# Patient Record
Sex: Female | Born: 1990 | Race: White | Hispanic: No | Marital: Married | Smoking: Never smoker
Health system: Southern US, Community
[De-identification: ages and names within clinical notes are randomized; demographics above are authoritative.]

## PROBLEM LIST (undated history)

## (undated) DIAGNOSIS — I10 Essential (primary) hypertension: Secondary | ICD-10-CM

## (undated) DIAGNOSIS — Z789 Other specified health status: Secondary | ICD-10-CM

## (undated) DIAGNOSIS — R519 Headache, unspecified: Secondary | ICD-10-CM

## (undated) HISTORY — DX: Headache, unspecified: R51.9

## (undated) HISTORY — PX: NO PAST SURGERIES: SHX2092

## (undated) HISTORY — PX: COLONOSCOPY: SHX174

## (undated) HISTORY — DX: Essential (primary) hypertension: I10

---

## 2018-10-21 LAB — OB RESULTS CONSOLE ABO/RH: RH Type: POSITIVE

## 2018-10-21 LAB — OB RESULTS CONSOLE GC/CHLAMYDIA
Chlamydia: NEGATIVE
Gonorrhea: NEGATIVE

## 2018-10-21 LAB — OB RESULTS CONSOLE HEPATITIS B SURFACE ANTIGEN: Hepatitis B Surface Ag: NEGATIVE

## 2018-10-21 LAB — OB RESULTS CONSOLE ANTIBODY SCREEN: Antibody Screen: NEGATIVE

## 2018-10-21 LAB — OB RESULTS CONSOLE HIV ANTIBODY (ROUTINE TESTING): HIV: NONREACTIVE

## 2018-10-21 LAB — OB RESULTS CONSOLE RUBELLA ANTIBODY, IGM: Rubella: IMMUNE

## 2018-10-21 LAB — OB RESULTS CONSOLE RPR: RPR: NONREACTIVE

## 2018-11-18 NOTE — L&D Delivery Note (Signed)
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  Presentation: vertex; Position: Left,, Occiput,, Anterior; Station: +4.  Verbal consent: obtained from patient.  Risks and benefits discussed in detail.  Risks include, but are not limited to the risks of anesthesia, bleeding, infection, damage to maternal tissues, fetal cephalhematoma.  There is also the risk of inability to effect vaginal delivery of the head, or shoulder dystocia that cannot be resolved by established maneuvers, leading to the need for emergency cesarean section.  APGAR: 9, 9; weight  pending.   Placenta status: spontaneous, intact.   Cord:  with the following complications: tight [place] x one cut on perineum.  Cord pH: na  Anesthesia:  epidural Instruments: kiwi x 2 pulls Episiotomy: None Lacerations: 2nd degree Suture Repair: 2.0 3.0 vicryl rapide Est. Blood Loss (mL): 274  Mom to postpartum.  Baby to Couplet care / Skin to Skin.  Kendra Meyer J 04/23/2019, 7:46 PM

## 2019-03-31 ENCOUNTER — Encounter (HOSPITAL_COMMUNITY): Payer: Self-pay

## 2019-03-31 ENCOUNTER — Observation Stay (HOSPITAL_COMMUNITY)
Admission: AD | Admit: 2019-03-31 | Discharge: 2019-04-01 | Disposition: A | Discharge status: Home / Self Care | Payer: Managed Care, Other (non HMO) | Attending: Obstetrics | Admitting: Obstetrics

## 2019-03-31 ENCOUNTER — Other Ambulatory Visit: Payer: Self-pay

## 2019-03-31 DIAGNOSIS — O163 Unspecified maternal hypertension, third trimester: Secondary | ICD-10-CM

## 2019-03-31 DIAGNOSIS — O133 Gestational [pregnancy-induced] hypertension without significant proteinuria, third trimester: Secondary | ICD-10-CM | POA: Diagnosis present

## 2019-03-31 DIAGNOSIS — O479 False labor, unspecified: Secondary | ICD-10-CM | POA: Diagnosis present

## 2019-03-31 DIAGNOSIS — O4700 False labor before 37 completed weeks of gestation, unspecified trimester: Secondary | ICD-10-CM | POA: Diagnosis present

## 2019-03-31 DIAGNOSIS — Z3A33 33 weeks gestation of pregnancy: Secondary | ICD-10-CM | POA: Diagnosis not present

## 2019-03-31 LAB — CBC WITH DIFFERENTIAL/PLATELET
Abs Immature Granulocytes: 0.11 10*3/uL — ABNORMAL HIGH (ref 0.00–0.07)
Basophils Absolute: 0 10*3/uL (ref 0.0–0.1)
Basophils Relative: 0 %
Eosinophils Absolute: 0.1 10*3/uL (ref 0.0–0.5)
Eosinophils Relative: 0 %
HCT: 34 % — ABNORMAL LOW (ref 36.0–46.0)
Hemoglobin: 11.9 g/dL — ABNORMAL LOW (ref 12.0–15.0)
Immature Granulocytes: 1 %
Lymphocytes Relative: 12 %
Lymphs Abs: 2.3 10*3/uL (ref 0.7–4.0)
MCH: 29.8 pg (ref 26.0–34.0)
MCHC: 35 g/dL (ref 30.0–36.0)
MCV: 85.2 fL (ref 80.0–100.0)
Monocytes Absolute: 1 10*3/uL (ref 0.1–1.0)
Monocytes Relative: 6 %
Neutro Abs: 15.3 10*3/uL — ABNORMAL HIGH (ref 1.7–7.7)
Neutrophils Relative %: 81 %
Platelets: 245 10*3/uL (ref 150–400)
RBC: 3.99 MIL/uL (ref 3.87–5.11)
RDW: 12.9 % (ref 11.5–15.5)
WBC: 18.8 10*3/uL — ABNORMAL HIGH (ref 4.0–10.5)
nRBC: 0 % (ref 0.0–0.2)

## 2019-03-31 LAB — COMPREHENSIVE METABOLIC PANEL
ALT: 17 U/L (ref 0–44)
AST: 16 U/L (ref 15–41)
Albumin: 2.9 g/dL — ABNORMAL LOW (ref 3.5–5.0)
Alkaline Phosphatase: 118 U/L (ref 38–126)
Anion gap: 11 (ref 5–15)
BUN: 7 mg/dL (ref 6–20)
CO2: 20 mmol/L — ABNORMAL LOW (ref 22–32)
Calcium: 9.3 mg/dL (ref 8.9–10.3)
Chloride: 106 mmol/L (ref 98–111)
Creatinine, Ser: 0.6 mg/dL (ref 0.44–1.00)
GFR calc Af Amer: >60 mL/min (ref >60)
GFR calc non Af Amer: >60 mL/min (ref >60)
Glucose, Bld: 82 mg/dL (ref 70–99)
Potassium: 3.8 mmol/L (ref 3.5–5.1)
Sodium: 137 mmol/L (ref 135–145)
Total Bilirubin: 0.5 mg/dL (ref 0.3–1.2)
Total Protein: 6.7 g/dL (ref 6.5–8.1)

## 2019-03-31 LAB — TYPE AND SCREEN
ABO/RH(D): A POS
Antibody Screen: NEGATIVE

## 2019-03-31 LAB — PROTEIN / CREATININE RATIO, URINE
Creatinine, Urine: 32.94 mg/dL
Protein Creatinine Ratio: 0.18 mg/mg{Cre} — ABNORMAL HIGH (ref 0.00–0.15)
Total Protein, Urine: 6 mg/dL

## 2019-03-31 MED ORDER — ZOLPIDEM TARTRATE 5 MG PO TABS
5.0000 mg | ORAL_TABLET | Freq: Every evening | ORAL | Status: DC | PRN
  Administered 2019-04-01: 5 mg via ORAL
  Filled 2019-03-31: qty 1

## 2019-03-31 MED ORDER — LACTATED RINGERS IV BOLUS
1000.0000 mL | Freq: Once | INTRAVENOUS | Status: AC
  Administered 2019-03-31: 1000 mL via INTRAVENOUS

## 2019-03-31 MED ORDER — CALCIUM CARBONATE ANTACID 500 MG PO CHEW: 2.0000 | CHEWABLE_TABLET | ORAL | Status: DC | PRN

## 2019-03-31 MED ORDER — PRENATAL MULTIVITAMIN CH
1.0000 | ORAL_TABLET | Freq: Every day | ORAL | Status: DC
  Administered 2019-04-01: 1 via ORAL
  Filled 2019-03-31: qty 1

## 2019-03-31 MED ORDER — SODIUM CHLORIDE 0.9% FLUSH
3.0000 mL | Freq: Two times a day (BID) | INTRAVENOUS | Status: DC
  Administered 2019-03-31 – 2019-04-01 (×2): 3 mL via INTRAVENOUS

## 2019-03-31 MED ORDER — ACETAMINOPHEN 500 MG PO TABS
1000.0000 mg | ORAL_TABLET | Freq: Once | ORAL | Status: AC
  Administered 2019-03-31: 15:00:00 1000 mg via ORAL
  Filled 2019-03-31: qty 2

## 2019-03-31 MED ORDER — NIFEDIPINE 10 MG PO CAPS
20.0000 mg | ORAL_CAPSULE | Freq: Once | ORAL | Status: AC
  Administered 2019-03-31: 18:00:00 20 mg via ORAL
  Filled 2019-03-31: qty 2

## 2019-03-31 MED ORDER — SODIUM CHLORIDE 0.9 % IV SOLN: 250.0000 mL | INTRAVENOUS | Status: DC | PRN

## 2019-03-31 MED ORDER — CYCLOBENZAPRINE HCL 10 MG PO TABS
10.0000 mg | ORAL_TABLET | Freq: Once | ORAL | Status: AC
  Administered 2019-03-31: 15:00:00 10 mg via ORAL
  Filled 2019-03-31: qty 1

## 2019-03-31 MED ORDER — ACETAMINOPHEN 325 MG PO TABS
650.0000 mg | ORAL_TABLET | ORAL | Status: DC | PRN
  Administered 2019-04-01: 650 mg via ORAL
  Filled 2019-03-31: qty 2

## 2019-03-31 MED ORDER — NIFEDIPINE 10 MG PO CAPS
10.0000 mg | ORAL_CAPSULE | ORAL | Status: AC | PRN
  Administered 2019-03-31 (×3): 10 mg via ORAL
  Filled 2019-03-31 (×3): qty 1

## 2019-03-31 MED ORDER — NIFEDIPINE 10 MG PO CAPS
10.0000 mg | ORAL_CAPSULE | Freq: Four times a day (QID) | ORAL | Status: DC
  Administered 2019-04-01 (×4): 10 mg via ORAL
  Filled 2019-03-31 (×5): qty 1

## 2019-03-31 MED ORDER — DOCUSATE SODIUM 100 MG PO CAPS
100.0000 mg | ORAL_CAPSULE | Freq: Every day | ORAL | Status: DC
  Administered 2019-03-31 – 2019-04-01 (×2): 100 mg via ORAL
  Filled 2019-03-31 (×2): qty 1

## 2019-03-31 MED ORDER — BETAMETHASONE SOD PHOS & ACET 6 (3-3) MG/ML IJ SUSP
12.0000 mg | INTRAMUSCULAR | Status: AC
  Administered 2019-03-31 – 2019-04-01 (×2): 12 mg via INTRAMUSCULAR
  Filled 2019-03-31 (×2): qty 2

## 2019-03-31 MED ORDER — SODIUM CHLORIDE 0.9% FLUSH: 3.0000 mL | INTRAVENOUS | Status: DC | PRN

## 2019-03-31 NOTE — H&P (Signed)
Chief complaint: Elevated blood pressures and contractions on NST  History of present illness: 28 year old G2 P0-0-1-0 at 33 weeks and 3 days who was seen in clinic today for routine visit and noted to have a blood pressure of 150/90.  Patient without history of chronic or gestational hypertension though she did have some mild elevated blood pressures noted at her OB visit in the early second trimester.  Due to the blood pressure patient was placed on a nonstress test and regular contractions were noted.  Patient was not feeling these contractions.  On arrival to MAU patient reports no headache, no vision change, no right upper quadrant pain, no swelling, good fetal movement, no leakage of fluid, no vaginal bleeding.  Further history reviewed, no history of LEEP, no recent abdominal trauma, no fever, no dysuria.  While in MAU contractions were noted to patient who soon after reported pain 7 out of 10.  After receiving 1 L of IV fluid and Procardia x3 patient reports no further pain despite seeing contractions on the monitor  Prenatal issues: Intermittent elevated blood pressures Leukocytosis of pregnancy.  Initial WBC of 15, WBC at 28 weeks 18  History reviewed. No pertinent past medical history.  History reviewed. No pertinent surgical history.  Social history: Patient has been quarantined at home and not working.  Husband works as an Art gallery manager from home.  Limited exposure to other people.   PE:  Vitals:   03/31/19 1500 03/31/19 1544 03/31/19 1545 03/31/19 1740  BP: 123/66 123/68 (!) 123/58 116/65  Pulse: 71 96 95 68  Resp:    18  Temp:    98.8 F (37.1 C)  TempSrc:    Oral  SpO2:    100%  Weight:    81.6 kg  Height:    5\' 4"  (1.626 m)   General: Well-appearing, no distress Abdomen: Gravid, no right upper quadrant pain, no fundal tenderness, no costovertebral angle tenderness Skin: Warm and dry Lower extremity: Nontender, no edema, Neuro: 2+ DTR Toco: Every 4 minutes,  regular FH: 130s, positive accelerations, no decelerations, 10 beat variability  CBC    Component Value Date/Time   WBC 18.8 (H) 03/31/2019 1329   RBC 3.99 03/31/2019 1329   HGB 11.9 (L) 03/31/2019 1329   HCT 34.0 (L) 03/31/2019 1329   PLT 245 03/31/2019 1329   MCV 85.2 03/31/2019 1329   MCH 29.8 03/31/2019 1329   MCHC 35.0 03/31/2019 1329   RDW 12.9 03/31/2019 1329   LYMPHSABS 2.3 03/31/2019 1329   MONOABS 1.0 03/31/2019 1329   EOSABS 0.1 03/31/2019 1329   BASOSABS 0.0 03/31/2019 1329   . CMP     Component Value Date/Time   NA 137 03/31/2019 1329   K 3.8 03/31/2019 1329   CL 106 03/31/2019 1329   CO2 20 (L) 03/31/2019 1329   GLUCOSE 82 03/31/2019 1329   BUN 7 03/31/2019 1329   CREATININE 0.60 03/31/2019 1329   CALCIUM 9.3 03/31/2019 1329   PROT 6.7 03/31/2019 1329   ALBUMIN 2.9 (L) 03/31/2019 1329   AST 16 03/31/2019 1329   ALT 17 03/31/2019 1329   ALKPHOS 118 03/31/2019 1329   BILITOT 0.5 03/31/2019 1329   GFRNONAA >60 03/31/2019 1329   GFRAA >60 03/31/2019 1329   Assessment and plan: 28 year old G2 P0-0-1-0 at 33 weeks with continued contractions on tocometry after 1 L of IV fluids and Procardia x3.  No cervical change to suggest preterm labor but given early gestational age and continued contractions will admit for  evaluation of preterm labor.  Will start betamethasone now and repeat in 24 hours.  We will continue Procardia every 6 hours.  Patient reports no problems with her first 3 doses.  If no cervical change demonstrated by tomorrow will allow discharge after second betamethasone dose and continue home Procardia until 36 weeks  Hypertension.  Very intermittent, no signs or symptoms of preeclampsia will watch blood pressures closely labs for preeclampsia negative.  Reactive fetal testing.  Given low risk for delivery at this time will defer screening ultrasound.  GBS also not yet done.  Kendra Meyer 03/31/2019 6:04 PM

## 2019-03-31 NOTE — MAU Provider Note (Signed)
History     CSN: [id number]  Arrival date and time: 03/31/19 1234   First Provider Initiated Contact with Patient 03/31/19 1308      Chief Complaint  Patient presents with  . Hypertension   HPI Kendra Meyer is a 28 y.o. G2P0010 at [redacted]w[redacted]d who presents to MAU for evaluation of elevated blood pressure as well as preterm contractions. She denies vaginal bleeding, leaking of fluid, decreased fetal movement,dysuria, fever, falls, or recent illness.    Elevated blood pressure in clinic Patient reports elevated BP in clinic today of 150s/90s. She denies headache, visual disturbances, RUQ/upper abdominal pain, new onset swelling.  Preterm Contractions Patient states she reported decreased fetal movement while in clinic today, was put on the monitor for an NST, then informed that she was having contractions. She rates her pain as 0/10 upon arrival in MAU then reported new onset contraction pain 7/10 shortly afterwards. She denies DFM upon arrival in MAU.  OB History    Gravida  2   Para      Term      Preterm      AB  1   Living        SAB  1   TAB      Ectopic      Multiple      Live Births              No past medical history on file.  History reviewed. No pertinent surgical history.  No family history on file.  Social History   Tobacco Use  . Smoking status: Not on file  Substance Use Topics  . Alcohol use: Not on file  . Drug use: Not on file    Allergies: Allergies not on file  No medications prior to admission.    Review of Systems  Constitutional: Negative for chills, fatigue and fever.  Gastrointestinal: Negative for abdominal pain, nausea and vomiting.  Genitourinary: Negative for difficulty urinating, dyspareunia, dysuria, vaginal bleeding, vaginal discharge and vaginal pain.  Musculoskeletal: Negative for back pain.  Neurological: Negative for headaches.  All other systems reviewed and are negative.  Physical Exam   Blood pressure  (!) 142/83, pulse 66, temperature 98.9 F (37.2 C), temperature source Oral, resp. rate 18, height 5\' 4"  (1.626 m), weight 81.6 kg, SpO2 99 %.  Physical Exam  Nursing note and vitals reviewed. Constitutional: She is oriented to person, place, and time. She appears well-developed and well-nourished.  Respiratory: Effort normal.  GI: Soft. She exhibits no distension. There is no abdominal tenderness. There is no rebound and no guarding.  Gravid  Neurological: She is alert and oriented to person, place, and time.  Skin: Skin is warm and dry.  Psychiatric: She has a normal mood and affect. Her behavior is normal. Judgment and thought content normal.    MAU Course  Procedures  --Reactive tracing: baseline 135, moderate variability, positive accels, no decels --Toco: irregular contractions q 2-5 min --Cervix remains closed after three hours in MAU --No significant improvement s/p 1L LR and Procardia x 3 --Patient denies pain at 1620 --Elevated WBCs consistent with patient baseline per scanned records --HPI, assessment and lab results discussed with  Dr. Ernestina Penna at [phone]  Patient Vitals for the past 24 hrs:  BP Temp Temp src Pulse Resp SpO2 Height Weight  03/31/19 1545 (!) 123/58 - - 95 - - - -  03/31/19 1544 123/68 - - 96 - - - -  03/31/19 1500 123/66 - - 71 - - - -  03/31/19 1445 138/84 - - 73 - - - -  03/31/19 1432 127/66 - - - - - - -  03/31/19 1430 127/66 - - (!) 57 - - - -  03/31/19 1415 122/72 - - 71 - - - -  03/31/19 1414 131/77 - - - - - - -  03/31/19 1400 131/77 - - - - - - -  03/31/19 1345 130/75 - - - - - - -  03/31/19 1330 132/77 - - - - - - -  03/31/19 1326 134/79 - - - - - - -  03/31/19 1258 (!) 142/83 98.9 F (37.2 C) Oral 66 18 99 %  (1.626 m) 81.6 kg    Results for orders placed or performed during the hospital encounter of 03/31/19 (from the past 24 hour(s))  Protein / creatinine ratio, urine     Status: Abnormal   Collection Time: 03/31/19  1:05 PM   Result Value Ref Range   Creatinine, Urine 32.94 mg/dL   Total Protein, Urine 6 mg/dL   Protein Creatinine Ratio 0.18 (H) 0.00 - 0.15 mg/mg[Cre]  CBC with Differential/Platelet     Status: Abnormal   Collection Time: 03/31/19  1:29 PM  Result Value Ref Range   WBC 18.8 (H) 4.0 - 10.5 K/uL   RBC 3.99 3.87 - 5.11 MIL/uL   Hemoglobin 11.9 (L) 12.0 - 15.0 g/dL   HCT 09.8 (L) 11.9 - 14.7 %   MCV 85.2 80.0 - 100.0 fL   MCH 29.8 26.0 - 34.0 pg   MCHC 35.0 30.0 - 36.0 g/dL   RDW 82.9 56.2 - 13.0 %   Platelets 245 150 - 400 K/uL   nRBC 0.0 0.0 - 0.2 %   Neutrophils Relative % 81 %   Neutro Abs 15.3 (H) 1.7 - 7.7 K/uL   Lymphocytes Relative 12 %   Lymphs Abs 2.3 0.7 - 4.0 K/uL   Monocytes Relative 6 %   Monocytes Absolute 1.0 0.1 - 1.0 K/uL   Eosinophils Relative 0 %   Eosinophils Absolute 0.1 0.0 - 0.5 K/uL   Basophils Relative 0 %   Basophils Absolute 0.0 0.0 - 0.1 K/uL   Immature Granulocytes 1 %   Abs Immature Granulocytes 0.11 (H) 0.00 - 0.07 K/uL  Comprehensive metabolic panel     Status: Abnormal   Collection Time: 03/31/19  1:29 PM  Result Value Ref Range   Sodium 137 135 - 145 mmol/L   Potassium 3.8 3.5 - 5.1 mmol/L   Chloride 106 98 - 111 mmol/L   CO2 20 (L) 22 - 32 mmol/L   Glucose, Bld 82 70 - 99 mg/dL   BUN 7 6 - 20 mg/dL   Creatinine, Ser 8.65 0.44 - 1.00 mg/dL   Calcium 9.3 8.9 - 78.4 mg/dL   Total Protein 6.7 6.5 - 8.1 g/dL   Albumin 2.9 (L) 3.5 - 5.0 g/dL   AST 16 15 - 41 U/L   ALT 17 0 - 44 U/L   Alkaline Phosphatase 118 38 - 126 U/L   Total Bilirubin 0.5 0.3 - 1.2 mg/dL   GFR calc non Af Amer >60 >60 mL/min   GFR calc Af Amer >60 >60 mL/min   Anion gap 11 5 - 15     Assessment and Plan  --28 y.o. G2P0010 at [redacted]w[redacted]d  --Reactive tracing --Cervix closed and posterior --BMZ 1 of 2 given in MAU --Per Dr. Ernestina Penna, admit to [facility]  --Admission approved by Dr Algernon Huxley in  NICU  Calvert CantorSamantha C Xylah Early, CNM 03/31/2019, 5:32 PM

## 2019-03-31 NOTE — MAU Note (Signed)
Pt presents to MAU from OB office due to elevated BP (150s/90s). She had NST done in office today due to decreased fetal movement and contractions were noticed. Pt denies any headache, visual changes and epigastric pain. +FM

## 2019-04-01 LAB — ABO/RH: ABO/RH(D): A POS

## 2019-04-01 MED ORDER — NIFEDIPINE 10 MG PO CAPS: 10.0000 mg | ORAL_CAPSULE | Freq: Four times a day (QID) | ORAL | 0 refills | Status: DC

## 2019-04-01 NOTE — Discharge Summary (Signed)
Physician Discharge Summary  Patient ID: Stepheny Ambrosia MRN: [id number] DOB/AGE: 1990/11/23 28 y.o.  Admit date: 03/31/2019 Discharge date: 04/01/2019  Admission Diagnoses: preterm contractions, elevated bp, prematurity  Discharge Diagnoses:  Active Problems:   Preterm contractions   Discharged Condition: good, fetal status reassuring  Hospital Course: Admitted for overnight observation  Consults: None  Significant Diagnostic Studies: none  Treatments: steroids: betamethasone; ivf; procardia po (tocolysis)  Discharge Exam: Blood pressure (!) 119/57, pulse 91, temperature 98.2 F (36.8 C), temperature source Oral, resp. rate 18, height 5\' 4"  (1.626 m), weight 81.8 kg, SpO2 98 %. Normal exam - see progress note  Disposition: Discharge disposition: 01-Home or Self Care       Discharge Instructions    Discharge instructions   Complete by:  As directed    Pelvic rest   Fetal Kick Count:  Lie on our left side for one hour after a meal, and count the number of times your baby kicks.  If it is less than 5 times, get up, move around and drink some juice.  Repeat the test 30 minutes later.  If it is still less than 5 kicks in an hour, notify your doctor.   Complete by:  As directed    Notify physician for a general feeling that "something is not right"   Complete by:  As directed    Notify physician for increase or change in vaginal discharge   Complete by:  As directed    Notify physician for intestinal cramps, with or without diarrhea, sometimes described as "gas pain"   Complete by:  As directed    Notify physician for leaking of fluid   Complete by:  As directed    Notify physician for low, dull backache, unrelieved by heat or Tylenol   Complete by:  As directed    Notify physician for menstrual like cramps   Complete by:  As directed    Notify physician for pelvic pressure   Complete by:  As directed    Notify physician for uterine contractions.  These may be  painless and feel like the uterus is tightening or the baby is  "balling up"   Complete by:  As directed    Notify physician for vaginal bleeding   Complete by:  As directed    PRETERM LABOR:  Includes any of the follwing symptoms that occur between 20 - [redacted] weeks gestation.  If these symptoms are not stopped, preterm labor can result in preterm delivery, placing your baby at risk   Complete by:  As directed    Sexual Activity:     Complete by:  As directed    Pelvic rest     Allergies as of 04/01/2019   No Known Allergies     Medication List    TAKE these medications   NIFEdipine 10 MG capsule Commonly known as:  PROCARDIA Take 1 capsule (10 mg total) by mouth every 6 (six) hours.   prenatal multivitamin Tabs tablet Take 1 tablet by mouth daily at 12 noon.        Signed: Vick Frees 04/01/2019, 4:30 PM

## 2019-04-01 NOTE — Progress Notes (Signed)
Hd#2 Ptc, 33.4 wga  Denies ctx, started to feel mild cramping/ctx at time procardia due (about noon) then resolved; no bleeding, lof, vb; +FM  Temp:  [97.8 F (36.6 C)-98.8 F (37.1 C)] 98.2 F (36.8 C) (05/14 1526) Pulse Rate:  [68-107] 91 (05/14 1157) Resp:  [16-18] 18 (05/14 1526) BP: (116-129)/(57-75) 119/57 (05/14 1526) SpO2:  [98 %-100 %] 98 % (05/14 1526) Weight:  [81.6 kg-81.8 kg] 81.8 kg (05/14 0545)  A&ox3 rrr ctab Abd: soft, nt, gravid Le: no edema, nt bilat Cx: deferred sine closed and rare ctx   FHT: cat 1, 120s, +accels, no decels, nml variability Toco: hours without ctx then up to 3 an hr but not regular  A/P: iup at 33.4 wga 1.  PTC - improved, nearly resolved; will d/c home today with outpt f/u; contin procardia 74m po q 6 hrs; will also have her decrease activities until f/u; pelvic rest 2. Fetal status reassuring 3. Prematurity - bmz x2 today 4. Elevated bp - resolved and now normal (low normal); monitor as outpt

## 2019-04-01 NOTE — Progress Notes (Signed)
Discharge teaching complete with pt. Pt understood all information and did not have any questions. Pt pushed via wheelchair and discharged home with family.

## 2019-04-03 LAB — CULTURE, OB URINE: Culture: >=100000 — AB

## 2019-04-05 LAB — OB RESULTS CONSOLE GBS: GBS: NEGATIVE

## 2019-04-11 ENCOUNTER — Encounter (HOSPITAL_COMMUNITY): Payer: Self-pay | Admitting: *Deleted

## 2019-04-11 ENCOUNTER — Inpatient Hospital Stay (HOSPITAL_COMMUNITY)
Admission: AD | Admit: 2019-04-11 | Discharge: 2019-04-11 | Disposition: A | Discharge status: Home / Self Care | Payer: Managed Care, Other (non HMO) | Source: Ambulatory Visit | Attending: Obstetrics and Gynecology | Admitting: Obstetrics and Gynecology

## 2019-04-11 ENCOUNTER — Other Ambulatory Visit: Payer: Self-pay

## 2019-04-11 DIAGNOSIS — R51 Headache: Secondary | ICD-10-CM | POA: Diagnosis not present

## 2019-04-11 DIAGNOSIS — Z3A35 35 weeks gestation of pregnancy: Secondary | ICD-10-CM | POA: Diagnosis not present

## 2019-04-11 DIAGNOSIS — O26893 Other specified pregnancy related conditions, third trimester: Secondary | ICD-10-CM | POA: Insufficient documentation

## 2019-04-11 DIAGNOSIS — O163 Unspecified maternal hypertension, third trimester: Secondary | ICD-10-CM | POA: Insufficient documentation

## 2019-04-11 DIAGNOSIS — Z3689 Encounter for other specified antenatal screening: Secondary | ICD-10-CM | POA: Diagnosis not present

## 2019-04-11 HISTORY — DX: Other specified health status: Z78.9

## 2019-04-11 LAB — COMPREHENSIVE METABOLIC PANEL
ALT: 21 U/L (ref 0–44)
AST: 16 U/L (ref 15–41)
Albumin: 2.8 g/dL — ABNORMAL LOW (ref 3.5–5.0)
Alkaline Phosphatase: 116 U/L (ref 38–126)
Anion gap: 9 (ref 5–15)
BUN: 9 mg/dL (ref 6–20)
CO2: 22 mmol/L (ref 22–32)
Calcium: 8.3 mg/dL — ABNORMAL LOW (ref 8.9–10.3)
Chloride: 102 mmol/L (ref 98–111)
Creatinine, Ser: 0.57 mg/dL (ref 0.44–1.00)
GFR calc Af Amer: >60 mL/min (ref >60)
GFR calc non Af Amer: >60 mL/min (ref >60)
Glucose, Bld: 83 mg/dL (ref 70–99)
Potassium: 3.5 mmol/L (ref 3.5–5.1)
Sodium: 133 mmol/L — ABNORMAL LOW (ref 135–145)
Total Bilirubin: 0.2 mg/dL — ABNORMAL LOW (ref 0.3–1.2)
Total Protein: 6.2 g/dL — ABNORMAL LOW (ref 6.5–8.1)

## 2019-04-11 LAB — URINALYSIS, ROUTINE W REFLEX MICROSCOPIC
Bilirubin Urine: NEGATIVE
Glucose, UA: NEGATIVE mg/dL
Hgb urine dipstick: NEGATIVE
Ketones, ur: NEGATIVE mg/dL
Nitrite: NEGATIVE
Protein, ur: NEGATIVE mg/dL
Specific Gravity, Urine: 1.006 (ref 1.005–1.030)
WBC, UA: >50 WBC/hpf — ABNORMAL HIGH (ref 0–5)
pH: 7 (ref 5.0–8.0)

## 2019-04-11 LAB — PROTEIN / CREATININE RATIO, URINE
Creatinine, Urine: 45.56 mg/dL
Protein Creatinine Ratio: 0.26 mg/mg{Cre} — ABNORMAL HIGH (ref 0.00–0.15)
Total Protein, Urine: 12 mg/dL

## 2019-04-11 LAB — CBC
HCT: 32.9 % — ABNORMAL LOW (ref 36.0–46.0)
Hemoglobin: 11.4 g/dL — ABNORMAL LOW (ref 12.0–15.0)
MCH: 30 pg (ref 26.0–34.0)
MCHC: 34.7 g/dL (ref 30.0–36.0)
MCV: 86.6 fL (ref 80.0–100.0)
Platelets: 203 10*3/uL (ref 150–400)
RBC: 3.8 MIL/uL — ABNORMAL LOW (ref 3.87–5.11)
RDW: 12.9 % (ref 11.5–15.5)
WBC: 20.4 10*3/uL — ABNORMAL HIGH (ref 4.0–10.5)
nRBC: 0 % (ref 0.0–0.2)

## 2019-04-11 MED ORDER — CYCLOBENZAPRINE HCL 10 MG PO TABS: 10.0000 mg | ORAL_TABLET | Freq: Three times a day (TID) | ORAL | 0 refills | Status: DC | PRN

## 2019-04-11 MED ORDER — ACETAMINOPHEN 500 MG PO TABS
1000.0000 mg | ORAL_TABLET | Freq: Once | ORAL | Status: AC
  Administered 2019-04-11: 1000 mg via ORAL
  Filled 2019-04-11: qty 2

## 2019-04-11 MED ORDER — NIFEDIPINE 10 MG PO CAPS
10.0000 mg | ORAL_CAPSULE | Freq: Once | ORAL | Status: AC
  Administered 2019-04-11: 10 mg via ORAL
  Filled 2019-04-11: qty 1

## 2019-04-11 MED ORDER — CYCLOBENZAPRINE HCL 10 MG PO TABS
10.0000 mg | ORAL_TABLET | Freq: Once | ORAL | Status: AC
  Administered 2019-04-11: 10 mg via ORAL
  Filled 2019-04-11: qty 1

## 2019-04-11 NOTE — MAU Provider Note (Addendum)
History     CSN: [id number]  Arrival date and time: 04/11/19 1811   First Provider Initiated Contact with Patient 04/11/19 1916      Chief Complaint  Patient presents with  . Hypertension  . Headache   Ms. Kendra Meyer is a 28 y.o. G2P0010 at [redacted]w[redacted]d who presents to MAU for preeclampsia evaluation after her BP this morning and afternoon at home were about 150s/90s. Pt reports she has also had those same elevated pressures at her OB's office and has had the size of her BP cuff checked by her OB provider to confirm appropriate fit.   Pt reports a HA that began about 2 days ago. Pt has not yet taken anything for HA. Rates HA as 7/10. Pt denies issues with HA prior to pregnancy.  Pt denies blurry vision/seeing spots but reports she did see "dots of light" yesterday for about , but denies these sx today. Pt denies N/V, epigastric pain, swelling in face and hands, sudden weight gain. Pt denies chest pain and SOB.  Pt denies constipation, diarrhea, or urinary problems. Pt denies fever, chills, fatigue, sweating or changes in appetite. Pt denies dizziness, light-headedness, weakness.  Pt denies VB, ctx, LOF and reports good FM.  Current pregnancy problems? high BP early in 2nd trimester Blood Type? A Positive Allergies? NKDA Current medications? PNVs, Procardia for ctx (  q6hrs, last took at 12pm), [facility]robid [facility], next appt 04/13/2019   OB History    Gravida  2   Para      Term      Preterm      AB  1   Living        SAB  1   TAB      Ectopic      Multiple      Live Births              Past Medical History:  Diagnosis Date  . Medical history non-contributory     Past Surgical History:  Procedure Laterality Date  . NO PAST SURGERIES      Family History  Problem Relation Age of Onset  . Cancer Father        colon  . Cancer Paternal Grandmother        breast  . Cancer Paternal Grandfather        colon     Social History   Tobacco Use  . Smoking status: Never Smoker  . Smokeless tobacco: Never Used  Substance Use Topics  . Alcohol use: Never    Frequency: Never  . Drug use: Never    Allergies: No Known Allergies  Medications Prior to Admission  Medication Sig Dispense Refill Last Dose  . NIFEdipine (PROCARDIA) 10 MG capsule Take 1 capsule (10 mg total) by mouth every 6 (six) hours. 120 capsule 0 04/11/2019 at 1200  . Prenatal Vit-Fe Fumarate-FA (PRENATAL MULTIVITAMIN) TABS tablet Take 1 tablet by mouth daily at 12 noon.   04/10/2019 at Unknown time    Review of Systems  Constitutional: Negative for chills, diaphoresis, fatigue and fever.  Eyes: Negative for visual disturbance.  Respiratory: Negative for shortness of breath.   Cardiovascular: Negative for chest pain.  Gastrointestinal: Negative for abdominal pain, constipation, diarrhea, nausea and vomiting.  Genitourinary: Negative for dysuria, flank pain, frequency, pelvic pain, urgency, vaginal bleeding and vaginal discharge.  Neurological: Positive for headaches. Negative for dizziness, weakness and light-headedness.   Physical Exam   Blood pressure (!) 121/91,  pulse 73, temperature 98.4 F (36.9 C), temperature source Oral, resp. rate 18, height 5\' 4"  (1.626 m), weight 82 kg, SpO2 99 %.  Patient Vitals for the past 24 hrs:  BP Temp Temp src Pulse Resp SpO2 Height Weight  04/11/19 2036 - 98.4 F (36.9 C) Oral - - - - -  04/11/19 2030 (!) 121/91 - - 73 - - - -  04/11/19 2015 111/66 - - 78 - - - -  04/11/19 2003 109/63 - - 62 - - - -  04/11/19 2001 (!) 87/61 - - 88 - - - -  04/11/19 1945 128/80 - - 73 - - - -  04/11/19 1930 132/86 - - 82 - - - -  04/11/19 1925 - - - - - 99 % - -  04/11/19 1920 - - - - - 99 % - -  04/11/19 1916 137/84 - - 72 - 98 % - -  04/11/19 1900 126/78 - - 79 - 98 % - -  04/11/19 1852 139/86 - - 84 - - - -  04/11/19 1828 138/89 97.6 F (36.4 C) Oral 83 18 97 % 5\' 4"  (1.626 m) 82 kg   Physical  Exam  Constitutional: She is oriented to person, place, and time. She appears well-developed and well-nourished. No distress.  HENT:  Head: Normocephalic and atraumatic.  Respiratory: Effort normal.  GI: Soft. She exhibits no distension and no mass. There is no abdominal tenderness. There is no rebound and no guarding.  Neurological: She is alert and oriented to person, place, and time.  Skin: Skin is warm and dry. She is not diaphoretic.  Psychiatric: She has a normal mood and affect. Her behavior is normal. Judgment and thought content normal.   Results for orders placed or performed during the hospital encounter of 04/11/19 (from the past 24 hour(s))  Urinalysis, Routine w reflex microscopic     Status: Abnormal   Collection Time: 04/11/19  6:32 PM  Result Value Ref Range   Color, Urine YELLOW YELLOW   APPearance HAZY (A) CLEAR   Specific Gravity, Urine 1.006 1.005 - 1.030   pH 7.0 5.0 - 8.0   Glucose, UA NEGATIVE NEGATIVE mg/dL   Hgb urine dipstick NEGATIVE NEGATIVE   Bilirubin Urine NEGATIVE NEGATIVE   Ketones, ur NEGATIVE NEGATIVE mg/dL   Protein, ur NEGATIVE NEGATIVE mg/dL   Nitrite NEGATIVE NEGATIVE   Leukocytes,Ua LARGE (A) NEGATIVE   RBC / HPF 6-10 0 - 5 RBC/hpf   WBC, UA >50 (H) 0 - 5 WBC/hpf   Bacteria, UA MANY (A) NONE SEEN   Squamous Epithelial / LPF 11-20 0 - 5   Mucus PRESENT    Budding Yeast PRESENT    Hyaline Casts, UA PRESENT   Protein / creatinine ratio, urine     Status: Abnormal   Collection Time: 04/11/19  7:05 PM  Result Value Ref Range   Creatinine, Urine 45.56 mg/dL   Total Protein, Urine 12 mg/dL   Protein Creatinine Ratio 0.26 (H) 0.00 - 0.15 mg/mg[Cre]  CBC     Status: Abnormal   Collection Time: 04/11/19  7:32 PM  Result Value Ref Range   WBC 20.4 (H) 4.0 - 10.5 K/uL   RBC 3.80 (L) 3.87 - 5.11 MIL/uL   Hemoglobin 11.4 (L) 12.0 - 15.0 g/dL   HCT 33.0 (L) 07.6 - 22.6 %   MCV 86.6 80.0 - 100.0 fL   MCH 30.0 26.0 - 34.0 pg   MCHC 34.7 30.0 -  36.0 g/dL   RDW 16.1 09.6 - 04.5 %   Platelets 203 150 - 400 K/uL   nRBC 0.0 0.0 - 0.2 %  Comprehensive metabolic panel     Status: Abnormal   Collection Time: 04/11/19  7:32 PM  Result Value Ref Range   Sodium 133 (L) 135 - 145 mmol/L   Potassium 3.5 3.5 - 5.1 mmol/L   Chloride 102 98 - 111 mmol/L   CO2 22 22 - 32 mmol/L   Glucose, Bld 83 70 - 99 mg/dL   BUN 9 6 - 20 mg/dL   Creatinine, Ser 4.09 0.44 - 1.00 mg/dL   Calcium 8.3 (L) 8.9 - 10.3 mg/dL   Total Protein 6.2 (L) 6.5 - 8.1 g/dL   Albumin 2.8 (L) 3.5 - 5.0 g/dL   AST 16 15 - 41 U/L   ALT 21 0 - 44 U/L   Alkaline Phosphatase 116 38 - 126 U/L   Total Bilirubin 0.2 (L) 0.3 - 1.2 mg/dL   GFR calc non Af Amer >60 >60 mL/min   GFR calc Af Amer >60 >60 mL/min   Anion gap 9 5 - 15   No results found.  MAU Course  Procedures  MDM -preeclampsia evaluation -UA: hazy/lg leuks/many bacteria/+yeast, sending urine for culture -CBC: WBCs 20.4 (was 18.8 on 03/31/2019), platelets 203 (down from 245 on 03/31/2019) -CMP: AST/ALT 16/21, no abnormalities requiring treatment -PCr: 0.26 (up from 0.18 on 03/31/2019) -  Tylenol given, pt reports HA now 4/10; Flexeril  given -EFM: reactive       -baseline: 135       -variability: moderate       -accels: present, 15x15       -decels: absent       -TOCO: few ctx that increased in frequency during pt stay, not felt by patient -spoke with Dr. Despina Hidden  - no concerns for WBCs in CBC if no fever or other evidence of infectious process, if CMP comes back WNL, pt OK to be discharged home with close interval f/u on 04/13/2019 -care transferred to K. Crisoforo Oxford, CNM; Flexeril  give for HA just given prior to transfer, note made to CNM about increased frequency of contractions Nugent, Odie Sera, NP  9:06 PM 04/11/2019   Orders Placed This Encounter  Procedures  . Culture, OB Urine    Standing Status:   Standing    Number of Occurrences:   1  . Urinalysis, Routine w reflex  microscopic    Standing Status:   Standing    Number of Occurrences:   1  . CBC    Standing Status:   Standing    Number of Occurrences:   1  . Comprehensive metabolic panel    Standing Status:   Standing    Number of Occurrences:   1  . Protein / creatinine ratio, urine    Standing Status:   Standing    Number of Occurrences:   1  . Discharge patient    Order Specific Question:   Discharge disposition    Answer:   01-Home or Self Care [1]    Order Specific Question:   Discharge patient date    Answer:   04/11/2019   Meds ordered this encounter  Medications  . acetaminophen (TYLENOL) tablet 1,000 mg  . cyclobenzaprine (FLEXERIL) tablet 10 mg  . cyclobenzaprine (FLEXERIL) 10 MG tablet    Sig: Take 1 tablet (10 mg total) by mouth 3 (three) times daily as needed for muscle spasms.  Dispense:  30 tablet    Refill:  0    Order Specific Question:   Supervising Provider    Answer:   Duane LopeEURE, LUTHER H [[id number]]   Assessment and Plan    -will call with culture results, if positive -strict preeclampsia/return MAU precautions given -discussed s/sx of PTL/PTD -RX for Flexeril sent to pharmacy  Joni ReiningNicole E Nugent 04/11/2019, 9:07 PM    -2116: patient will get Flexeril and Procardia for her contractions, as she missed her evening dose of Procardia.  2144: Patient feeling well; desires discharge. She has prenatal visit scheduled for Tuesday. Had one elevated BP while in her MAU stay.  HA still 4/10; contractions not bothering her.   Patient Vitals for the past 24 hrs:  BP Temp Temp src Pulse Resp SpO2 Height Weight  04/11/19 2129 127/81 - - 64 - - - -  04/11/19 2128 127/81 - - - - - - -  04/11/19 2115 129/82 - - 74 - - - -  04/11/19 2100 115/72 - - 73 - - - -  04/11/19 2045 125/76 - - 66 - - - -  04/11/19 2036 - 98.4 F (36.9 C) Oral - - - - -  04/11/19 2030 (!) 121/91 - - 73 - - - -  04/11/19 2015 111/66 - - 78 - - - -  04/11/19 2003 109/63 - - 62 - - - -  04/11/19 2001 (!) 87/61 -  - 88 - - - -  04/11/19 1945 128/80 - - 73 - - - -  04/11/19 1930 132/86 - - 82 - - - -  04/11/19 1925 - - - - - 99 % - -  04/11/19 1920 - - - - - 99 % - -  04/11/19 1916 137/84 - - 72 - 98 % - -  04/11/19 1900 126/78 - - 79 - 98 % - -  04/11/19 1852 139/86 - - 84 - - - -  04/11/19 1828 138/89 97.6 F (36.4 C) Oral 83 18 97 % 5\' 4"  (1.626 m) 82 kg    Kendra Meyer

## 2019-04-11 NOTE — Discharge Instructions (Signed)
.  Preeclampsia and Eclampsia    Preeclampsia is a serious condition that may develop during pregnancy. It is also called toxemia of pregnancy. This condition causes high blood pressure along with other symptoms, such as swelling and headaches. These symptoms may develop as the condition gets worse. Preeclampsia may occur at 20 weeks of pregnancy or later.  Diagnosing and treating preeclampsia early is very important. If not treated early, it can cause serious problems for you and your baby. One problem it can lead to is eclampsia. Eclampsia is a condition that causes muscle jerking or shaking (convulsions or seizures) and other serious problems for the mother. During pregnancy, delivering your baby may be the best treatment for preeclampsia or eclampsia. For most women, preeclampsia and eclampsia symptoms go away after giving birth.  In rare cases, a woman may develop preeclampsia after giving birth (postpartum preeclampsia). This usually occurs within 48 hours after childbirth but may occur up to 6 weeks after giving birth.  What are the causes?  The cause of preeclampsia is not known.  What increases the risk?  The following risk factors make you more likely to develop preeclampsia:   Being pregnant for the first time.   Having had preeclampsia during a past pregnancy.   Having a family history of preeclampsia.   Having high blood pressure.   Being pregnant with more than one baby.   Being 35 or older.   Being African-American.   Having kidney disease or diabetes.   Having medical conditions such as lupus or blood diseases.   Being very overweight (obese).  What are the signs or symptoms?  The earliest signs of preeclampsia are:   High blood pressure.   Increased protein in your urine. Your health care provider will check for this at every visit before you give birth (prenatal visit).  Other symptoms that may develop as the condition gets worse include:   Severe headaches.   Sudden weight  gain.   Swelling of the hands, face, legs, and feet.   Nausea and vomiting.   Vision problems, such as blurred or double vision.   Numbness in the face, arms, legs, and feet.   Urinating less than usual.   Dizziness.   Slurred speech.   Abdominal pain, especially upper abdominal pain.   Convulsions or seizures.  How is this diagnosed?  There are no screening tests for preeclampsia. Your health care provider will ask you about symptoms and check for signs of preeclampsia during your prenatal visits. You may also have tests that include:   Urine tests.   Blood tests.   Checking your blood pressure.   Monitoring your baby's heart rate.   Ultrasound.  How is this treated?  You and your health care provider will determine the treatment approach that is best for you. Treatment may include:   Having more frequent prenatal exams to check for signs of preeclampsia, if you have an increased risk for preeclampsia.   Medicine to lower your blood pressure.   Staying in the hospital, if your condition is severe. There, treatment will focus on controlling your blood pressure and the amount of fluids in your body (fluid retention).   Taking medicine (magnesium sulfate) to prevent seizures. This may be given as an injection or through an IV.   Taking a low-dose aspirin during your pregnancy.   Delivering your baby early, if your condition gets worse. You may have your labor started with medicine (induced), or you may have a cesarean   delivery.  Follow these instructions at home:  Eating and drinking     Drink enough fluid to keep your urine pale yellow.   Avoid caffeine.  Lifestyle   Do not use any products that contain nicotine or tobacco, such as cigarettes and e-cigarettes. If you need help quitting, ask your health care provider.   Do not use alcohol or drugs.   Avoid stress as much as possible. Rest and get plenty of sleep.  General instructions   Take over-the-counter and prescription medicines only as  told by your health care provider.   When lying down, lie on your left side. This keeps pressure off your major blood vessels.   When sitting or lying down, raise (elevate) your feet. Try putting some pillows underneath your lower legs.   Exercise regularly. Ask your health care provider what kinds of exercise are best for you.   Keep all follow-up and prenatal visits as told by your health care provider. This is important.  How is this prevented?  There is no known way of preventing preeclampsia or eclampsia from developing. However, to lower your risk of complications and detect problems early:   Get regular prenatal care. Your health care provider may be able to diagnose and treat the condition early.   Maintain a healthy weight. Ask your health care provider for help managing weight gain during pregnancy.   Work with your health care provider to manage any long-term (chronic) health conditions you have, such as diabetes or kidney problems.   You may have tests of your blood pressure and kidney function after giving birth.   Your health care provider may have you take low-dose aspirin during your next pregnancy.  Contact a health care provider if:   You have symptoms that your health care provider told you may require more treatment or monitoring, such as:  ? Headaches.  ? Nausea or vomiting.  ? Abdominal pain.  ? Dizziness.  ? Light-headedness.  Get help right away if:   You have severe:  ? Abdominal pain.  ? Headaches that do not get better.  ? Dizziness.  ? Vision problems.  ? Confusion.  ? Nausea or vomiting.   You have any of the following:  ? A seizure.  ? Sudden, rapid weight gain.  ? Sudden swelling in your hands, ankles, or face.  ? Trouble moving any part of your body.  ? Numbness in any part of your body.  ? Trouble speaking.  ? Abnormal bleeding.   You faint.  Summary   Preeclampsia is a serious condition that may develop during pregnancy. It is also called toxemia of pregnancy.   This  condition causes high blood pressure along with other symptoms, such as swelling and headaches.   Diagnosing and treating preeclampsia early is very important. If not treated early, it can cause serious problems for you and your baby.   Get help right away if you have symptoms that your health care provider told you to watch for.  This information is not intended to replace advice given to you by your health care provider. Make sure you discuss any questions you have with your health care provider.  Document Released: 11/01/2000 Document Revised: 10/21/2017 Document Reviewed: 06/10/2016  Elsevier Interactive Patient Education  2019 [employer]

## 2019-04-11 NOTE — MAU Note (Addendum)
Pt  Reports elevated bp of 150/90 at home today around 0900 and 1630.  Pt reports headache for the past 2 days but has not taken any medication for it. Pt took her 10mg  dose of procardia at noon.  Pt reports some visual disturbance yesterday, but none today.  Pt denies upper quad. Pain.  Pt denies ctx or LOF and reports good fetal movement.

## 2019-04-12 LAB — CULTURE, OB URINE

## 2019-04-15 ENCOUNTER — Telehealth (HOSPITAL_COMMUNITY): Payer: Self-pay | Admitting: *Deleted

## 2019-04-15 ENCOUNTER — Other Ambulatory Visit: Payer: Self-pay | Admitting: Obstetrics and Gynecology

## 2019-04-15 ENCOUNTER — Encounter (HOSPITAL_COMMUNITY): Payer: Self-pay | Admitting: *Deleted

## 2019-04-15 NOTE — Telephone Encounter (Signed)
Preadmission screen  

## 2019-04-23 ENCOUNTER — Other Ambulatory Visit: Payer: Self-pay

## 2019-04-23 ENCOUNTER — Inpatient Hospital Stay (HOSPITAL_COMMUNITY): Payer: Managed Care, Other (non HMO) | Admitting: Anesthesiology

## 2019-04-23 ENCOUNTER — Encounter (HOSPITAL_COMMUNITY): Payer: Self-pay

## 2019-04-23 ENCOUNTER — Inpatient Hospital Stay (HOSPITAL_COMMUNITY)
Admission: AD | Admit: 2019-04-23 | Discharge: 2019-04-26 | DRG: 806 | Disposition: A | Discharge status: Home / Self Care | Payer: Managed Care, Other (non HMO) | Attending: Obstetrics and Gynecology | Admitting: Obstetrics and Gynecology

## 2019-04-23 DIAGNOSIS — O9902 Anemia complicating childbirth: Secondary | ICD-10-CM | POA: Diagnosis present

## 2019-04-23 DIAGNOSIS — D649 Anemia, unspecified: Secondary | ICD-10-CM | POA: Diagnosis present

## 2019-04-23 DIAGNOSIS — O1414 Severe pre-eclampsia complicating childbirth: Secondary | ICD-10-CM | POA: Diagnosis present

## 2019-04-23 DIAGNOSIS — Z1159 Encounter for screening for other viral diseases: Secondary | ICD-10-CM

## 2019-04-23 DIAGNOSIS — O1410 Severe pre-eclampsia, unspecified trimester: Secondary | ICD-10-CM | POA: Diagnosis present

## 2019-04-23 DIAGNOSIS — R609 Edema, unspecified: Secondary | ICD-10-CM | POA: Diagnosis present

## 2019-04-23 DIAGNOSIS — O9912 Other diseases of the blood and blood-forming organs and certain disorders involving the immune mechanism complicating childbirth: Secondary | ICD-10-CM | POA: Diagnosis present

## 2019-04-23 DIAGNOSIS — Z3A36 36 weeks gestation of pregnancy: Secondary | ICD-10-CM | POA: Diagnosis not present

## 2019-04-23 DIAGNOSIS — D72829 Elevated white blood cell count, unspecified: Secondary | ICD-10-CM | POA: Diagnosis present

## 2019-04-23 DIAGNOSIS — O42013 Preterm premature rupture of membranes, onset of labor within 24 hours of rupture, third trimester: Principal | ICD-10-CM | POA: Diagnosis present

## 2019-04-23 LAB — URINALYSIS, ROUTINE W REFLEX MICROSCOPIC
Bilirubin Urine: NEGATIVE
Glucose, UA: NEGATIVE mg/dL
Ketones, ur: NEGATIVE mg/dL
Leukocytes,Ua: NEGATIVE
Nitrite: NEGATIVE
Protein, ur: 100 mg/dL — AB
RBC / HPF: >50 RBC/hpf — ABNORMAL HIGH (ref 0–5)
Specific Gravity, Urine: 1.008 (ref 1.005–1.030)
pH: 7 (ref 5.0–8.0)

## 2019-04-23 LAB — CBC
HCT: 33.8 % — ABNORMAL LOW (ref 36.0–46.0)
HCT: 34.4 % — ABNORMAL LOW (ref 36.0–46.0)
Hemoglobin: 11.6 g/dL — ABNORMAL LOW (ref 12.0–15.0)
Hemoglobin: 12 g/dL (ref 12.0–15.0)
MCH: 29.9 pg (ref 26.0–34.0)
MCH: 29.9 pg (ref 26.0–34.0)
MCHC: 34.3 g/dL (ref 30.0–36.0)
MCHC: 34.9 g/dL (ref 30.0–36.0)
MCV: 87.1 fL (ref 80.0–100.0)
MCV: 85.6 fL (ref 80.0–100.0)
Platelets: 225 10*3/uL (ref 150–400)
Platelets: 235 10*3/uL (ref 150–400)
RBC: 3.88 MIL/uL (ref 3.87–5.11)
RBC: 4.02 MIL/uL (ref 3.87–5.11)
RDW: 12.8 % (ref 11.5–15.5)
RDW: 12.8 % (ref 11.5–15.5)
WBC: 20 10*3/uL — ABNORMAL HIGH (ref 4.0–10.5)
WBC: 23.8 10*3/uL — ABNORMAL HIGH (ref 4.0–10.5)
nRBC: 0 % (ref 0.0–0.2)
nRBC: 0 % (ref 0.0–0.2)

## 2019-04-23 LAB — SARS CORONAVIRUS 2 BY RT PCR (HOSPITAL ORDER, PERFORMED IN ~~LOC~~ HOSPITAL LAB): SARS Coronavirus 2: NEGATIVE

## 2019-04-23 LAB — COMPREHENSIVE METABOLIC PANEL
ALT: 18 U/L (ref 0–44)
AST: 18 U/L (ref 15–41)
Albumin: 2.7 g/dL — ABNORMAL LOW (ref 3.5–5.0)
Alkaline Phosphatase: 139 U/L — ABNORMAL HIGH (ref 38–126)
Anion gap: 10 (ref 5–15)
BUN: 7 mg/dL (ref 6–20)
CO2: 21 mmol/L — ABNORMAL LOW (ref 22–32)
Calcium: 9.5 mg/dL (ref 8.9–10.3)
Chloride: 105 mmol/L (ref 98–111)
Creatinine, Ser: 0.64 mg/dL (ref 0.44–1.00)
GFR calc Af Amer: >60 mL/min (ref >60)
GFR calc non Af Amer: >60 mL/min (ref >60)
Glucose, Bld: 90 mg/dL (ref 70–99)
Potassium: 3.7 mmol/L (ref 3.5–5.1)
Sodium: 136 mmol/L (ref 135–145)
Total Bilirubin: 0.3 mg/dL (ref 0.3–1.2)
Total Protein: 5.9 g/dL — ABNORMAL LOW (ref 6.5–8.1)

## 2019-04-23 LAB — TYPE AND SCREEN
ABO/RH(D): A POS
Antibody Screen: NEGATIVE

## 2019-04-23 LAB — RPR: RPR Ser Ql: NONREACTIVE

## 2019-04-23 LAB — PROTEIN / CREATININE RATIO, URINE
Creatinine, Urine: 37.37 mg/dL
Protein Creatinine Ratio: 2.19 mg/mg{Cre} — ABNORMAL HIGH (ref 0.00–0.15)
Total Protein, Urine: 82 mg/dL

## 2019-04-23 LAB — MAGNESIUM: Magnesium: 4 mg/dL — ABNORMAL HIGH (ref 1.7–2.4)

## 2019-04-23 MED ORDER — OXYCODONE-ACETAMINOPHEN 5-325 MG PO TABS: 1.0000 | ORAL_TABLET | ORAL | Status: DC | PRN

## 2019-04-23 MED ORDER — BUPIVACAINE HCL (PF) 0.25 % IJ SOLN
INTRAMUSCULAR | Status: DC | PRN
  Administered 2019-04-23: 5 mL via EPIDURAL

## 2019-04-23 MED ORDER — TERBUTALINE SULFATE 1 MG/ML IJ SOLN: 0.2500 mg | Freq: Once | INTRAMUSCULAR | Status: DC | PRN

## 2019-04-23 MED ORDER — OXYTOCIN 40 UNITS IN NORMAL SALINE INFUSION - SIMPLE MED
2.5000 [IU]/h | INTRAVENOUS | Status: DC
  Filled 2019-04-23: qty 1000

## 2019-04-23 MED ORDER — MAGNESIUM SULFATE BOLUS VIA INFUSION
4.0000 g | Freq: Once | INTRAVENOUS | Status: AC
  Administered 2019-04-23: 4 g via INTRAVENOUS
  Filled 2019-04-23: qty 500

## 2019-04-23 MED ORDER — LIDOCAINE HCL (PF) 1 % IJ SOLN
INTRAMUSCULAR | Status: DC | PRN
  Administered 2019-04-23: 5 mL via EPIDURAL

## 2019-04-23 MED ORDER — PHENYLEPHRINE 40 MCG/ML (10ML) SYRINGE FOR IV PUSH (FOR BLOOD PRESSURE SUPPORT)
80.0000 ug | PREFILLED_SYRINGE | INTRAVENOUS | Status: DC | PRN
  Filled 2019-04-23: qty 10

## 2019-04-23 MED ORDER — FENTANYL-BUPIVACAINE-NACL 0.5-0.125-0.9 MG/250ML-% EP SOLN: 12.0000 mL/h | EPIDURAL | Status: DC | PRN

## 2019-04-23 MED ORDER — TRANEXAMIC ACID-NACL 1000-0.7 MG/100ML-% IV SOLN
INTRAVENOUS | Status: AC
  Filled 2019-04-23: qty 100

## 2019-04-23 MED ORDER — EPHEDRINE 5 MG/ML INJ
10.0000 mg | INTRAVENOUS | Status: DC | PRN
  Filled 2019-04-23: qty 2

## 2019-04-23 MED ORDER — TETANUS-DIPHTH-ACELL PERTUSSIS 5-2.5-18.5 LF-MCG/0.5 IM SUSP: 0.5000 mL | Freq: Once | INTRAMUSCULAR | Status: DC

## 2019-04-23 MED ORDER — FENTANYL CITRATE (PF) 100 MCG/2ML IJ SOLN
100.0000 ug | Freq: Once | INTRAMUSCULAR | Status: AC
  Administered 2019-04-23: 100 ug via EPIDURAL

## 2019-04-23 MED ORDER — EPHEDRINE 5 MG/ML INJ: 10.0000 mg | INTRAVENOUS | Status: DC | PRN

## 2019-04-23 MED ORDER — SODIUM CHLORIDE (PF) 0.9 % IJ SOLN
INTRAMUSCULAR | Status: DC | PRN
  Administered 2019-04-23: 12 mL/h via EPIDURAL

## 2019-04-23 MED ORDER — ACETAMINOPHEN 325 MG PO TABS: 650.0000 mg | ORAL_TABLET | ORAL | Status: DC | PRN

## 2019-04-23 MED ORDER — DIBUCAINE (PERIANAL) 1 % EX OINT: 1.0000 "application " | TOPICAL_OINTMENT | CUTANEOUS | Status: DC | PRN

## 2019-04-23 MED ORDER — WITCH HAZEL-GLYCERIN EX PADS: 1.0000 "application " | MEDICATED_PAD | CUTANEOUS | Status: DC | PRN

## 2019-04-23 MED ORDER — MAGNESIUM SULFATE 40 G IN LACTATED RINGERS - SIMPLE
INTRAVENOUS | Status: AC
  Filled 2019-04-23: qty 500

## 2019-04-23 MED ORDER — LACTATED RINGERS IV SOLN
500.0000 mL | Freq: Once | INTRAVENOUS | Status: AC
  Administered 2019-04-23: 500 mL via INTRAVENOUS

## 2019-04-23 MED ORDER — PHENYLEPHRINE 40 MCG/ML (10ML) SYRINGE FOR IV PUSH (FOR BLOOD PRESSURE SUPPORT): 80.0000 ug | PREFILLED_SYRINGE | INTRAVENOUS | Status: DC | PRN

## 2019-04-23 MED ORDER — OXYTOCIN BOLUS FROM INFUSION
500.0000 mL | Freq: Once | INTRAVENOUS | Status: AC
  Administered 2019-04-23: 500 mL via INTRAVENOUS

## 2019-04-23 MED ORDER — COCONUT OIL OIL: 1.0000 "application " | TOPICAL_OIL | Status: DC | PRN

## 2019-04-23 MED ORDER — LACTATED RINGERS IV SOLN: 500.0000 mL | Freq: Once | INTRAVENOUS | Status: DC

## 2019-04-23 MED ORDER — LABETALOL HCL 100 MG PO TABS
100.0000 mg | ORAL_TABLET | Freq: Two times a day (BID) | ORAL | Status: DC
  Administered 2019-04-23: 100 mg via ORAL
  Filled 2019-04-23: qty 1

## 2019-04-23 MED ORDER — OXYCODONE-ACETAMINOPHEN 5-325 MG PO TABS: 2.0000 | ORAL_TABLET | ORAL | Status: DC | PRN

## 2019-04-23 MED ORDER — ONDANSETRON HCL 4 MG/2ML IJ SOLN: 4.0000 mg | INTRAMUSCULAR | Status: DC | PRN

## 2019-04-23 MED ORDER — FENTANYL CITRATE (PF) 100 MCG/2ML IJ SOLN
100.0000 ug | INTRAMUSCULAR | Status: DC | PRN
  Filled 2019-04-23: qty 2

## 2019-04-23 MED ORDER — MAGNESIUM SULFATE 40 G IN LACTATED RINGERS - SIMPLE
2.0000 g/h | INTRAVENOUS | Status: AC
  Administered 2019-04-23: 2 g/h via INTRAVENOUS
  Filled 2019-04-23: qty 500

## 2019-04-23 MED ORDER — DIPHENHYDRAMINE HCL 50 MG/ML IJ SOLN: 12.5000 mg | INTRAMUSCULAR | Status: DC | PRN

## 2019-04-23 MED ORDER — SIMETHICONE 80 MG PO CHEW: 80.0000 mg | CHEWABLE_TABLET | ORAL | Status: DC | PRN

## 2019-04-23 MED ORDER — BENZOCAINE-MENTHOL 20-0.5 % EX AERO
1.0000 "application " | INHALATION_SPRAY | CUTANEOUS | Status: DC | PRN
  Administered 2019-04-23: 1 via TOPICAL
  Filled 2019-04-23: qty 56

## 2019-04-23 MED ORDER — FENTANYL-BUPIVACAINE-NACL 0.5-0.125-0.9 MG/250ML-% EP SOLN
12.0000 mL/h | EPIDURAL | Status: DC | PRN
  Filled 2019-04-23: qty 250

## 2019-04-23 MED ORDER — IBUPROFEN 600 MG PO TABS
600.0000 mg | ORAL_TABLET | Freq: Four times a day (QID) | ORAL | Status: DC
  Administered 2019-04-23 – 2019-04-26 (×10): 600 mg via ORAL
  Filled 2019-04-23 (×10): qty 1

## 2019-04-23 MED ORDER — ONDANSETRON HCL 4 MG PO TABS: 4.0000 mg | ORAL_TABLET | ORAL | Status: DC | PRN

## 2019-04-23 MED ORDER — LACTATED RINGERS IV SOLN: 500.0000 mL | INTRAVENOUS | Status: DC | PRN

## 2019-04-23 MED ORDER — TRANEXAMIC ACID-NACL 1000-0.7 MG/100ML-% IV SOLN
1000.0000 mg | Freq: Once | INTRAVENOUS | Status: AC
  Administered 2019-04-23: 1000 mg via INTRAVENOUS

## 2019-04-23 MED ORDER — SOD CITRATE-CITRIC ACID 500-334 MG/5ML PO SOLN: 30.0000 mL | ORAL | Status: DC | PRN

## 2019-04-23 MED ORDER — FLEET ENEMA 7-19 GM/118ML RE ENEM: 1.0000 | ENEMA | Freq: Every day | RECTAL | Status: DC | PRN

## 2019-04-23 MED ORDER — LACTATED RINGERS IV SOLN
INTRAVENOUS | Status: DC
  Administered 2019-04-23 (×2): via INTRAVENOUS

## 2019-04-23 MED ORDER — ONDANSETRON HCL 4 MG/2ML IJ SOLN: 4.0000 mg | Freq: Four times a day (QID) | INTRAMUSCULAR | Status: DC | PRN

## 2019-04-23 MED ORDER — LIDOCAINE HCL (PF) 1 % IJ SOLN: 30.0000 mL | INTRAMUSCULAR | Status: DC | PRN

## 2019-04-23 MED ORDER — OXYTOCIN 40 UNITS IN NORMAL SALINE INFUSION - SIMPLE MED
1.0000 m[IU]/min | INTRAVENOUS | Status: DC
  Administered 2019-04-23: 2 m[IU]/min via INTRAVENOUS

## 2019-04-23 MED ORDER — SENNOSIDES-DOCUSATE SODIUM 8.6-50 MG PO TABS
2.0000 | ORAL_TABLET | ORAL | Status: DC
  Administered 2019-04-23 – 2019-04-25 (×3): 2 via ORAL
  Filled 2019-04-23 (×3): qty 2

## 2019-04-23 MED ORDER — ZOLPIDEM TARTRATE 5 MG PO TABS: 5.0000 mg | ORAL_TABLET | Freq: Every evening | ORAL | Status: DC | PRN

## 2019-04-23 MED ORDER — PRENATAL MULTIVITAMIN CH
1.0000 | ORAL_TABLET | Freq: Every day | ORAL | Status: DC
  Administered 2019-04-24 – 2019-04-25 (×2): 1 via ORAL
  Filled 2019-04-23 (×2): qty 1

## 2019-04-23 MED ORDER — DIPHENHYDRAMINE HCL 25 MG PO CAPS: 25.0000 mg | ORAL_CAPSULE | Freq: Four times a day (QID) | ORAL | Status: DC | PRN

## 2019-04-23 MED ORDER — LACTATED RINGERS AMNIOINFUSION
INTRAVENOUS | Status: DC
  Administered 2019-04-23: 14:00:00 via INTRAUTERINE
  Filled 2019-04-23 (×2): qty 1000

## 2019-04-23 MED ORDER — FENTANYL CITRATE (PF) 100 MCG/2ML IJ SOLN
INTRAMUSCULAR | Status: DC | PRN
  Administered 2019-04-23 (×2): 50 ug via EPIDURAL

## 2019-04-23 NOTE — MAU Note (Signed)
Pt woke up at 0130 w/SROM, clear w/small amount of blood. Denies cntrx, +Fetal movement.

## 2019-04-23 NOTE — Anesthesia Preprocedure Evaluation (Addendum)
Anesthesia Evaluation  Patient identified by MRN, date of birth, ID band Patient awake    Reviewed: Allergy & Precautions, Patient's Chart, lab work & pertinent test results  Airway Mallampati: I       Dental no notable dental hx.    Pulmonary    Pulmonary exam normal        Cardiovascular hypertension, Normal cardiovascular exam     Neuro/Psych  Headaches,    GI/Hepatic negative GI ROS, Neg liver ROS,   Endo/Other    Renal/GU      Musculoskeletal   Abdominal   Peds  Hematology   Anesthesia Other Findings   Reproductive/Obstetrics (+) Pregnancy                             Anesthesia Physical Anesthesia Plan  ASA: II  Anesthesia Plan: Epidural   Post-op Pain Management:    Induction:   PONV Risk Score and Plan:   Airway Management Planned:   Additional Equipment: None  Intra-op Plan:   Post-operative Plan:   Informed Consent: I have reviewed the patients History and Physical, chart, labs and discussed the procedure including the risks, benefits and alternatives for the proposed anesthesia with the patient or authorized representative who has indicated his/her understanding and acceptance.       Plan Discussed with:   Anesthesia Plan Comments: (Lab Results      Component                Value               Date                      WBC                      23.8 (H)            04/23/2019                HGB                      12.0                04/23/2019                HCT                      34.4 (L)            04/23/2019                MCV                      85.6                04/23/2019                PLT                      235                 04/23/2019            COVID-19 Labs  No results for input(s): DDIMER, FERRITIN, LDH, CRP in the last 72 hours.  Lab Results      Component  Value               Date                      [id number]               NEGATIVE            04/23/2019            )        Anesthesia Quick Evaluation

## 2019-04-23 NOTE — MAU Note (Signed)
Covid swab obtained without difficulty. Pt tol well 

## 2019-04-23 NOTE — Progress Notes (Signed)
IOL at 36+5 weeks with SROM and pre-eclampsia with severe features.   S:  Has been pushing with contractions for 3 hours.  Reports exhaustion.   O:  Pitocin at 14 milliunits; Amnioinfusion 150mL/hr, Magnesium sulfate infusing at 2 grams/hr VS: Blood pressure (!) 149/75, pulse 95, temperature (!) 97.5 F (36.4 C), temperature source Oral, resp. rate 20, height 5\' 4"  (1.626 m), weight 83.4 kg, SpO2 99 %.        FHR : baseline 140 bpm / variability moderate / accelerations +15x15 / variable and occasional late decelerations        Toco: contractions every 3-4 minutes / strong / MVU - adequate        Cervix : c/c/+3; caput noted; tight fit, feels to be ROP        Membranes: clear fluid   A/P:Activelabor FHR category 2 GBS negative  Pre-eclampsia  - on Mag sulfate, no s/s of toxicity - BPs mild range to normal on Labetalol 100mg  BID PPROM  - no s/s of chorio; afebrile Plan for TXA immediately after delivery due to high PPH risk  Called Dr. Billy Coast to update status and to come assess for vacuum assistance.                Carlean Jews, MSN, CNM [facility]

## 2019-04-23 NOTE — Progress Notes (Signed)
Late entry note: I assumed care and received report from Dr. Cherly Hensen at 1pm.  I came to assess patient at 1400. IOL at 36+5 weeks with SROM and pre-eclampsia with severe features.   S:  Comfortable with epidural, but just now feeling intense rectal pressure. Denies HA, visual changes, RUQ/epigastric pain  O:  Pitocin at 14 milliunits; Amnioinfusion bolus infusing now per Dr. Jorene Minors orders for variable decelerations; Magnesium sulfate infusing at 2 grams/hr  VS: Blood pressure 137/88, pulse (!) 119, temperature 98.2 F (36.8 C), temperature source Oral, resp. rate 16, height  (1.626 m), weight 83.4 kg, SpO2 95 %.        04/23/19 1500  -  119Abnormal   -  -  137/88  -  -  -  - EN   04/23/19 1458  98.2 F (36.8 C)  -  -  16  -  -  -  -  - EN   04/23/19 1432  -  127Abnormal   -  -  137/81  -  -  -  - EN   04/23/19 1430  -  -  -  16  -  -  -  -  - EN   04/23/19 1401  -  98  -  -  148/86Abnormal   -  -  -  - EN   04/23/19 1400  -  -  -  16  -  -  -  -  - EN   04/23/19 1331  -  87  -  -  136/84  -  -  -  - EN   04/23/19 1306  -  89  -  -  145/70Abnormal   -  -  -  - EN   04/23/19 1305  -  -  -  -  -  -  95 %  -  - EN   04/23/19 1300  98 F (36.7 C)  78  -  16  119/62  -  93 %  -  - EN      FHR : baseline 130 bpm / variability moderate / accelerations +15x15 / variable decelerations        Toco: contractions every 3 minutes /moderate-strong/ MVU 170-200         Cervix : 9/90/0 more cervix on right side        Membranes: SROM since 0130 clear fluid; bloody show Results for orders placed or performed during the hospital encounter of 04/23/19 (from the past 24 hour(s))  Urinalysis, Routine w reflex microscopic     Status: Abnormal   Collection Time: 04/23/19  3:37 AM  Result Value Ref Range   Color, Urine YELLOW YELLOW   APPearance CLOUDY (A) CLEAR   Specific Gravity, Urine 1.008 1.005 - 1.030   pH 7.0 5.0 - 8.0   Glucose, UA NEGATIVE NEGATIVE mg/dL   Hgb urine dipstick LARGE (A)  NEGATIVE   Bilirubin Urine NEGATIVE NEGATIVE   Ketones, ur NEGATIVE NEGATIVE mg/dL   Protein, ur 161 (A) NEGATIVE mg/dL   Nitrite NEGATIVE NEGATIVE   Leukocytes,Ua NEGATIVE NEGATIVE   RBC / HPF >50 (H) 0 - 5 RBC/hpf   WBC, UA 21-50 0 - 5 WBC/hpf   Bacteria, UA RARE (A) NONE SEEN   Squamous Epithelial / LPF 11-20 0 - 5  Protein / creatinine ratio, urine     Status: Abnormal   Collection Time: 04/23/19  3:37 AM  Result Value Ref Range  Creatinine, Urine 37.37 mg/dL   Total Protein, Urine 82 mg/dL   Protein Creatinine Ratio 2.19 (H) 0.00 - 0.15 mg/mg[Cre]  SARS Coronavirus 2 (CEPHEID - Performed in [facility] hospital lab), Hosp Order     Status: None   Collection Time: 04/23/19  4:17 AM  Result Value Ref Range   SARS Coronavirus 2 NEGATIVE NEGATIVE  Comprehensive metabolic panel     Status: Abnormal   Collection Time: 04/23/19  4:33 AM  Result Value Ref Range   Sodium 136 135 - 145 mmol/L   Potassium 3.7 3.5 - 5.1 mmol/L   Chloride 105 98 - 111 mmol/L   CO2 21 (L) 22 - 32 mmol/L   Glucose, Bld 90 70 - 99 mg/dL   BUN 7 6 - 20 mg/dL   Creatinine, Ser 1.610.64 0.44 - 1.00 mg/dL   Calcium 9.5 8.9 - 09.610.3 mg/dL   Total Protein 5.9 (L) 6.5 - 8.1 g/dL   Albumin 2.7 (L) 3.5 - 5.0 g/dL   AST 18 15 - 41 U/L   ALT 18 0 - 44 U/L   Alkaline Phosphatase 139 (H) 38 - 126 U/L   Total Bilirubin 0.3 0.3 - 1.2 mg/dL   GFR calc non Af Amer >60 >60 mL/min   GFR calc Af Amer >60 >60 mL/min   Anion gap 10 5 - 15  CBC     Status: Abnormal   Collection Time: 04/23/19  4:33 AM  Result Value Ref Range   WBC 20.0 (H) 4.0 - 10.5 K/uL   RBC 3.88 3.87 - 5.11 MIL/uL   Hemoglobin 11.6 (L) 12.0 - 15.0 g/dL   HCT 04.533.8 (L) 40.936.0 - 81.146.0 %   MCV 87.1 80.0 - 100.0 fL   MCH 29.9 26.0 - 34.0 pg   MCHC 34.3 30.0 - 36.0 g/dL   RDW 91.412.8 78.211.5 - 95.615.5 %   Platelets 225 150 - 400 K/uL   nRBC 0.0 0.0 - 0.2 %  RPR     Status: None   Collection Time: 04/23/19  4:33 AM  Result Value Ref Range   RPR Ser Ql Non Reactive  Non Reactive  Type and screen [facility]     Status: None   Collection Time: 04/23/19  4:33 AM  Result Value Ref Range   ABO/RH(D) A POS    Antibody Screen NEG    Sample Expiration      04/26/2019,2359 Performed at [facility], [address] [place], [place] [address]   Magnesium     Status: Abnormal   Collection Time: 04/23/19 11:55 AM  Result Value Ref Range   Magnesium 4.0 (H) 1.7 - 2.4 mg/dL  CBC     Status: Abnormal   Collection Time: 04/23/19 11:55 AM  Result Value Ref Range   WBC 23.8 (H) 4.0 - 10.5 K/uL   RBC 4.02 3.87 - 5.11 MIL/uL   Hemoglobin 12.0 12.0 - 15.0 g/dL   HCT 65.734.4 (L) 84.636.0 - 96.246.0 %   MCV 85.6 80.0 - 100.0 fL   MCH 29.9 26.0 - 34.0 pg   MCHC 34.9 30.0 - 36.0 g/dL   RDW 95.212.8 84.111.5 - 32.415.5 %   Platelets 235 150 - 400 K/uL   nRBC 0.0 0.0 - 0.2 %   A/P: Active labor         FHR category 2         GBS negative          Pre-eclampsia    -  on Mag sulfate, no s/s of toxicity   - BPs mild range to normal on Labetalol 100mg  BID          PPROM    - no s/s of chorio Right exaggerated sims position Reassess in 1 hour      Anticipate NSVD  Dr. Billy Coast updated and at bedside to check in    Carlean Jews, MSN, CNM [facility]

## 2019-04-23 NOTE — Anesthesia Procedure Notes (Signed)
Epidural Patient location during procedure: OB Start time: 04/23/2019 12:33 PM End time: 04/23/2019 12:39 PM  Staffing Anesthesiologist: Shelton Silvas, MD Performed: anesthesiologist   Preanesthetic Checklist Completed: patient identified, site marked, surgical consent, pre-op evaluation, timeout performed, IV checked, risks and benefits discussed and monitors and equipment checked  Epidural Patient position: sitting Prep: ChloraPrep Patient monitoring: heart rate, continuous pulse ox and blood pressure Approach: midline Location: L3-L4 Injection technique: LOR saline  Needle:  Needle type: Tuohy  Needle gauge: 17 G Needle length: 9 cm Catheter type: closed end flexible Catheter size: 20 Guage Test dose: negative and 1.5% lidocaine  Assessment Events: blood not aspirated, injection not painful, no injection resistance and no paresthesia  Additional Notes LOR @ 5  Patient identified. Risks/Benefits/Options discussed with patient including but not limited to bleeding, infection, nerve damage, paralysis, failed block, incomplete pain control, headache, blood pressure changes, nausea, vomiting, reactions to medications, itching and postpartum back pain. Confirmed with bedside nurse the patient's most recent platelet count. Confirmed with patient that they are not currently taking any anticoagulation, have any bleeding history or any family history of bleeding disorders. Patient expressed understanding and wished to proceed. All questions were answered. Sterile technique was used throughout the entire procedure. Please see nursing notes for vital signs. Test dose was given through epidural catheter and negative prior to continuing to dose epidural or start infusion. Warning signs of high block given to the patient including shortness of breath, tingling/numbness in hands, complete motor block, or any concerning symptoms with instructions to call for help. Patient was given instructions on  fall risk and not to get out of bed. All questions and concerns addressed with instructions to call with any issues or inadequate analgesia.    Reason for block:procedure for pain

## 2019-04-23 NOTE — H&P (Signed)
Kendra Meyer is a 28 y.o. female presenting @ 36 5/[redacted] weeks gestation with c/o SROM clear fluid starting at 1:30 am. (+) FM. GBS cx neg.  Gest HTN noted starting at 33 wk. BMZ completed OB History    Gravida  2   Para      Term      Preterm      AB  1   Living        SAB  1   TAB      Ectopic      Multiple      Live Births             Past Medical History:  Diagnosis Date  . Headache   . Hypertension   . Medical history non-contributory    Past Surgical History:  Procedure Laterality Date  . COLONOSCOPY    . NO PAST SURGERIES     Family History: family history includes Alcohol abuse in her brother; Cancer in her father, paternal grandfather, and paternal grandmother. Social History:  reports that she has never smoked. She has never used smokeless tobacco. She reports that she does not drink alcohol or use drugs.     Maternal Diabetes: No Genetic Screening: Declined, nl AFP1  Maternal Ultrasounds/Referrals: Normal Fetal Ultrasounds or other Referrals:  None Maternal Substance Abuse:  No Significant Maternal Medications:  None Significant Maternal Lab Results:  Lab values include: Group B Strep negative Other Comments:   BMZ 5/13, 5/14, gestational HTN  Review of Systems  Eyes: Negative for blurred vision.  Cardiovascular: Negative for leg swelling.  Gastrointestinal: Negative for heartburn and vomiting.  Neurological: Negative for headaches.   Maternal Medical History:  Reason for admission: Rupture of membranes and contractions.   Prenatal complications: Gestational HTN  Prenatal Complications - Diabetes: none.    Dilation: 2.5 Effacement (%): 70 Station: -3 Exam by:: TLYTLE RN  Blood pressure (!) 146/90, pulse 68, temperature 98.2 F (36.8 C), temperature source Oral, resp. rate 18, height 5\' 4"  (1.626 m), weight 83.4 kg, SpO2 99 %. Exam Physical Exam  Constitutional: She is oriented to person, place, and time. She appears  well-developed and well-nourished.  HENT:  Head: Atraumatic.  Eyes: EOM are normal.  Neck: Neck supple.  Cardiovascular: Regular rhythm.  Respiratory: Breath sounds normal.  GI: Soft.  Musculoskeletal:        General: No edema.  Neurological: She is alert and oriented to person, place, and time. She has normal reflexes.  Skin: Skin is warm and dry.  Psychiatric: She has a normal mood and affect.    EFW 7lb  Prenatal labs: ABO, Rh: --/--/A POS, A POS (05/13 1830) Antibody: NEG (05/13 1830) Rubella: Immune (12/04 0000) RPR: Nonreactive (12/04 0000)  HBsAg: Negative (12/04 0000)  HIV: Non-reactive (12/04 0000)  GBS: Negative (05/18 0000)   Assessment/Plan: PPROM Gestational HTN IUP@ 36 5/7 weeks P) admit. PIH labs. Pitocin augmentation prn. Analgesic prn.   Lennis Korb A Kennedy Brines 04/23/2019,   Addendum: Patient Vitals for the past 24 hrs:  BP Temp Temp src Pulse Resp SpO2 Height Weight  04/23/19 0558 (!) 137/95 98.4 F (36.9 C) Oral 72 15 - - -  04/23/19 0542 (!) 154/97 98.4 F (36.9 C) Oral 79 15 - - -  04/23/19 0530 (!) 143/82 - - (!) 56 - - - -  04/23/19 0501 139/86 - - 78 - - - -  04/23/19 0446 (!) 145/89 - - 71 - - - -  04/23/19 [id number] Marland Kitchen(!)  142/89 - - 65 - - - -  04/23/19 0416 (!) 146/90 - - 68 - - - -  04/23/19 0415 - - - - - 99 % - -  04/23/19 0410 - - - - - 98 % - -  04/23/19 0405 - - - - - 98 % - -  04/23/19 0400 - - - - - 98 % - -  04/23/19 0355 - - - - - 99 % - -  04/23/19 0353 (!) 165/92 - - 73 - - - -  04/23/19 0340 - - - - - 97 % - -  04/23/19 0339 (!) 151/90 - - 64 - - - -  04/23/19 0335 - - - - - 98 % - -  04/23/19 0313 (!) 141/91 - - - - - - -  04/23/19 0312 - 98.2 F (36.8 C) Oral 70 18 99 % 5\' 4"  (1.626 m) 83.4 kg    CMP Latest Ref Rng & Units 04/23/2019 04/11/2019 03/31/2019  Glucose 70 - 99 mg/dL 90 83 82  BUN 6 - 20 mg/dL 7 9 7   Creatinine 0.44 - 1.00 mg/dL 8.00 3.49 1.79  Sodium 135 - 145 mmol/L 136 133(L) 137  Potassium 3.5 - 5.1 mmol/L 3.7 3.5  3.8  Chloride 98 - 111 mmol/L 105 102 106  CO2 22 - 32 mmol/L 21(L) 22 20(L)  Calcium 8.9 - 10.3 mg/dL 9.5 1.5(A) 9.3  Total Protein 6.5 - 8.1 g/dL 5.9(L) 6.2(L) 6.7  Total Bilirubin 0.3 - 1.2 mg/dL 0.3 5.6(P) 0.5  Alkaline Phos 38 - 126 U/L 139(H) 116 118  AST 15 - 41 U/L 18 16 16   ALT 0 - 44 U/L 18 21 17   protein/creatinine ratio 2.19 CBC Latest Ref Rng & Units 04/23/2019 04/11/2019 03/31/2019  WBC 4.0 - 10.5 K/uL 20.0(H) 20.4(H) 18.8(H)  Hemoglobin 12.0 - 15.0 g/dL 11.6(L) 11.4(L) 11.9(L)  Hematocrit 36.0 - 46.0 % 33.8(L) 32.9(L) 34.0(L)  Platelets 150 - 400 K/uL 225 203 245  IMP: mild preeclampsia  P) initiate magnesium for seixure prophylaxis Start labetalol bid  pitocin augmentation   tracing: baseline 135  (+) accel Ctx q 4-5 mins

## 2019-04-23 NOTE — Consult Note (Signed)
Called to attend this vacuum-assist vaginal delivery at 36 5/[redacted] weeks gestation.  Infant came out crying spontaneously so delivery team was excused by Dr. Billy Coast.   Overton Mam, MD (Attending Neonatologist)

## 2019-04-23 NOTE — Progress Notes (Signed)
S: notes painful ctx  O: pitocin 8 miu Patient Vitals for the past 24 hrs:  BP Temp Temp src Pulse Resp SpO2 Height Weight  04/23/19 1001 140/85 - - 79 - - - -  04/23/19 1000 - - - - 18 - - -  04/23/19 0931 (!) 142/80 - - 85 - - - -  04/23/19 0930 - - - - 18 - - -  04/23/19 0902 139/79 - - 86 - - - -  04/23/19 0900 - 97.9 F (36.6 C) Oral - 16 - - -  04/23/19 0831 139/85 - - 88 18 - - -  04/23/19 0806 135/81 - - 76 - - - -  04/23/19 0801 (!) 142/81 - - 82 16 - - -  04/23/19 0751 (!) 145/86 - - 83 16 - - -  04/23/19 0741 133/76 - - 79 16 - - -  04/23/19 0735 134/78 - - 73 - - - -  04/23/19 0720 - 98.6 F (37 C) Oral - 16 - - -  04/23/19 0658 (!) 150/93 - - 66 16 - - -  04/23/19 0558 (!) 137/95 98.4 F (36.9 C) Oral 72 15 - - -  04/23/19 0542 (!) 154/97 98.4 F (36.9 C) Oral 79 15 - - -  04/23/19 0530 (!) 143/82 - - (!) 56 - - - -  04/23/19 0501 139/86 - - 78 - - - -  04/23/19 0446 (!) 145/89 - - 71 - - - -  04/23/19 0438 (!) 142/89 - - 65 - - - -  04/23/19 0416 (!) 146/90 - - 68 - - - -  04/23/19 0415 - - - - - 99 % - -  04/23/19 0410 - - - - - 98 % - -  04/23/19 0405 - - - - - 98 % - -  04/23/19 0400 - - - - - 98 % - -  04/23/19 0355 - - - - - 99 % - -  04/23/19 0353 (!) 165/92 - - 73 - - - -  04/23/19 0340 - - - - - 97 % - -  04/23/19 0339 (!) 151/90 - - 64 - - - -  04/23/19 0335 - - - - - 98 % - -  04/23/19 0313 (!) 141/91 - - - - - - -  04/23/19 0312 - 98.2 F (36.8 C) Oral 70 18 99 % 5\' 4"  (1.626 m) 83.4 kg  VE 3/70/-3 Clear fluid  tracing baseline 130 (+) accel to 150  ctx q 2-3 mins  IMP: latent phase PPROM  Mild preeclampsia on magnesium, labetolol P) right exaggerated sims. Cont pitocin. Check mag level, analgesic prn

## 2019-04-23 NOTE — MAU Note (Signed)
Pt up to BR

## 2019-04-23 NOTE — Progress Notes (Signed)
S:  Pushing with contractions   O:  Pitocin at 14 milliunits; Amnioinfusion 186mL/hr, Magnesium sulfate infusing at 2 grams/hr  VS: Blood pressure (!) 146/92, pulse 95, temperature 98.3 F (36.8 C), temperature source Oral, resp. rate 20, height 5\' 4"  (1.626 m), weight 83.4 kg, SpO2 99 %.        FHR : baseline 140 bpm / variability moderate / accelerations +15x15 / variable decelerations with pushing and occasional late decelerations        Toco: contractions every 2-3 minutes / strong / MVU - adequate        Cervix : Dilation: 10 Dilation Complete Date: 04/23/19 Dilation Complete Time: 1544 Effacement (%): 100 Cervical Position: Posterior Station: Plus 3 Presentation: Vertex Exam by:: Carlean Jews, CNM        Membranes: clear, bloody   A/P: Active labor         FHR category 2         GBS negative          Pre-eclampsia                          - on Mag sulfate, no s/s of toxicity                         - BPs mild range to normal on Labetalol 100mg  BID          PPROM                          - no s/s of chorio Continuing pushing; alternate pushing positions Anticipate NSVD Plan for TXA immediately after delivery due to high PPH risk    Carlean Jews, MSN, CNM [facility]

## 2019-04-24 DIAGNOSIS — R609 Edema, unspecified: Secondary | ICD-10-CM | POA: Diagnosis present

## 2019-04-24 DIAGNOSIS — O1410 Severe pre-eclampsia, unspecified trimester: Secondary | ICD-10-CM | POA: Diagnosis present

## 2019-04-24 LAB — CBC
HCT: 28.4 % — ABNORMAL LOW (ref 36.0–46.0)
Hemoglobin: 9.8 g/dL — ABNORMAL LOW (ref 12.0–15.0)
MCH: 29.9 pg (ref 26.0–34.0)
MCHC: 34.5 g/dL (ref 30.0–36.0)
MCV: 86.6 fL (ref 80.0–100.0)
Platelets: 190 10*3/uL (ref 150–400)
RBC: 3.28 MIL/uL — ABNORMAL LOW (ref 3.87–5.11)
RDW: 12.9 % (ref 11.5–15.5)
WBC: 25.2 10*3/uL — ABNORMAL HIGH (ref 4.0–10.5)
nRBC: 0 % (ref 0.0–0.2)

## 2019-04-24 MED ORDER — POLYSACCHARIDE IRON COMPLEX 150 MG PO CAPS
150.0000 mg | ORAL_CAPSULE | Freq: Every day | ORAL | Status: DC
  Administered 2019-04-24 – 2019-04-26 (×3): 150 mg via ORAL
  Filled 2019-04-24 (×3): qty 1

## 2019-04-24 MED ORDER — LACTATED RINGERS IV SOLN
INTRAVENOUS | Status: DC
  Administered 2019-04-24 (×2): via INTRAVENOUS

## 2019-04-24 MED ORDER — MAGNESIUM OXIDE 400 (241.3 MG) MG PO TABS
400.0000 mg | ORAL_TABLET | Freq: Every day | ORAL | Status: DC
  Administered 2019-04-24 – 2019-04-26 (×3): 400 mg via ORAL
  Filled 2019-04-24 (×3): qty 1

## 2019-04-24 NOTE — Lactation Note (Signed)
This note was copied from a baby's chart. Lactation Consultation Note  Patient Name: Kendra Meyer [id number] Date: 04/24/2019 Reason for consult: Initial assessment;Difficult latch;Primapara;1st time breastfeeding;Late-preterm 34-36.6wks;Infant < 6lbs(maternal htn)  [phone] - 28 - Mom and dad took the prenatal online class via [facility]. Mom does have a DEBP personal pump at home.   I visited Ms. Lipford to conduct initial breast feeding education and to offer assistance. She states that her daughter "Kendra Meyer" has a difficult time latching. She has been attempting with the use of a size 20 nipple shield (provided by RN). Mom has also been supplementing baby with some expressed breast milk (she had some EBM in a snappie upon entry) and formula.  I first educated mom on LPT feeding behaviors and reviewed the LPT guidelines. I recommended that mom and dad both do some STS and reviewed supplementation guidelines.  Mom has already initiated pumping. After I helped her breast feed, I then helped mom pump. Her nipples are short/flattish with areolar edema. They are pliable, however and compressible. They evert with compression. I set mom up initially with a size 27 flange, but as I observed her pump, we switched to a size 24 flange. Mom feels more comfortable with a 24. Her right nipple appears more flat than her left, and she may need a different size flange for each. We discussed this.  Before pumping, I reviewed hand expression and discussed it's value to breast milk production and supplementation. Mom can express her breast milk. We then woke Kendra Meyer up and placed her in cross cradle hold on mom's right breast. I used a teacup hold to evert mom's nipple, and Kendra Meyer latched with rhythmic suckling sequences.   Kendra Meyer fed for a few minutes and then came off the breast. We re-latched her one time and then she came off after a few minutes and became frustrated.   I showed mom how to fit her size 20 nipple shield  after this attempt in the event she needs to use this to be independent with breast feeding today. Baby grasped the nipple shield but would not suckle. We ended the feeding.  I showed dad how to pace bottle feed baby (I encouraged cup feeding today, but parents also want to know how to pace feed prior to discharge). Baby took 8 mls by bottle. We discussed the risks of early usage of artificial nipples.  While dad fed, mom pumped. I shared our community breast feeding resources with mom, and reviewed plan. I encouraged mom to allow baby to practice at the breast and to lick and learn. After this, mom is to supplement baby and pump. I reviewed pumping guidelines.    Maternal Data Formula Feeding for Exclusion: No Has patient been taught Hand Expression?: Yes Does the patient have breastfeeding experience prior to this delivery?: No  Feeding Feeding Type: Breast Milk with Formula added  LATCH Score Latch: Grasps breast easily, tongue down, lips flanged, rhythmical sucking.  Audible Swallowing: A few with stimulation  Type of Nipple: Flat  Comfort (Breast/Nipple): Soft / non-tender  Hold (Positioning): Assistance needed to correctly position infant at breast and maintain latch.  LATCH Score: 7  Interventions Interventions: Breast feeding basics reviewed;Assisted with latch;Skin to skin;Breast massage;Hand express;Breast compression;Adjust position;DEBP  Lactation Tools Discussed/Used Tools: Pump;Flanges;Nipple [place] Fuel;Bottle Nipple shield size: 20 Flange Size: 27;24;Other (comment)(began with 27; mom prefers the 24 flange) Breast pump type: Double-Electric Breast Pump Pump Review: Setup, frequency, and cleaning;Milk Storage Initiated by:: hl Date initiated:: 04/24/19  Consult Status Consult Status: Follow-up Date: 04/25/19 Follow-up type: In-patient    Walker ShadowHeather Evangelene Vora 04/24/2019, 10:27 AM

## 2019-04-24 NOTE — Progress Notes (Signed)
Patient ID: Leonila Speranza, female   DOB: 1991-02-19, 28 y.o.   MRN: [id number] PPD # 1 S/P VAVD  Live born female  Birth Weight: 5 lb 15.4 oz (2705 g) APGAR: 9, 9  Newborn Delivery   Birth date/time:  04/23/2019 18:59:00 Delivery type:  Vaginal, Vacuum (Extractor)    Baby name: [name] Delivering provider: Brien Few  Episiotomy:None   Lacerations:2nd degree   Feeding: breast and bottle  Pain control at delivery: Epidural   S:  Reports feeling tired but well, denies PEC s/sx             Tolerating po/ No nausea or vomiting             Bleeding is light             Pain controlled with ibuprofen (OTC)             Up ad lib / ambulatory / voiding without difficulties   O:  A & O x 3, in no apparent distress              VS:  Vitals:   04/23/19 2213 04/23/19 2326 04/24/19 0049 04/24/19 0419  BP: (!) 139/116 138/82 (!) 145/90 127/73  Pulse: 94 94 99 81  Resp: 18 18 18 18   Temp: 97.7 F (36.5 C) 98 F (36.7 C)  98.1 F (36.7 C)  TempSrc:  Oral Oral Oral  SpO2:  100% 100% 100%  Weight:      Height:        LABS:  Recent Labs    04/23/19 1155 04/24/19 0330  WBC 23.8* 25.2*  HGB 12.0 9.8*  HCT 34.4* 28.4*  PLT 235 190    Blood type: --/--/A POS (06/05 9485)  Rubella: Immune (12/04 0000)   I&O:  Intake/Output Summary (Last 24 hours) at 04/24/2019 0857 Last data filed at 04/24/2019 4627 Gross per 24 hour  Intake 5852.56 ml  Output 4849 ml  Net 1003.56 ml    Vaccines: TDaP UTD         Flu    declined   Lungs: Clear and unlabored  Heart: regular rate and rhythm / no murmurs  Abdomen: soft, non-tender, non-distended             Fundus: firm, non-tender, U-2  Perineum: repair intact, no edema  Lochia: small  Extremities: trace edema, no calf pain or tenderness    A/P: PPD # 1 28 y.o., [id number]   Principal Problem:   Postpartum care following vaginal delivery 6/5 Active Problems:   Normal labor   Vacuum-assisted vaginal delivery   Perineal  laceration, second degree   Severe preeclampsia on Mag Sulfate seizure protection, improving, no neural sx, diuresing, BP labile, one severe range BP last night  - labetalol 100 mg PO BID to continue  - improving, no neural sx, diuresing   - Mag Sulfate x 24 hrs PP, DC at 1900 Maternal anemia  - started oral Fe and Mag Ox Leukocytosis  - afebrile, uterus non-tender, no foul lochia  - continue observation, rpt CBC in AM Breastfeeding  - lactation support, first time mother  Encouraged rest when baby sleeping   Juliene Pina, MSN, CNM 04/24/2019, 8:56 AM

## 2019-04-25 LAB — CBC
HCT: 31.4 % — ABNORMAL LOW (ref 36.0–46.0)
Hemoglobin: 10.4 g/dL — ABNORMAL LOW (ref 12.0–15.0)
MCH: 29.8 pg (ref 26.0–34.0)
MCHC: 33.1 g/dL (ref 30.0–36.0)
MCV: 90 fL (ref 80.0–100.0)
Platelets: 202 10*3/uL (ref 150–400)
RBC: 3.49 MIL/uL — ABNORMAL LOW (ref 3.87–5.11)
RDW: 13.4 % (ref 11.5–15.5)
WBC: 20.8 10*3/uL — ABNORMAL HIGH (ref 4.0–10.5)
nRBC: 0 % (ref 0.0–0.2)

## 2019-04-25 MED ORDER — LABETALOL HCL 100 MG PO TABS
100.0000 mg | ORAL_TABLET | Freq: Two times a day (BID) | ORAL | Status: DC
  Administered 2019-04-25 – 2019-04-26 (×3): 100 mg via ORAL
  Filled 2019-04-25 (×3): qty 1

## 2019-04-25 NOTE — Lactation Note (Signed)
This note was copied from a baby's chart. Lactation Consultation Note  Patient Name: Kendra Meyer [id number] Date: 04/25/2019 Reason for consult: Follow-up assessment;Late-preterm 34-36.6wks   Baby 80 hours old.  [redacted]w[redacted]d at birth.  Per parents baby has been sleepy at the breast and mother is regularly pumping.  Encouraged pumping at least 8 times per day. Mother hand expressed prior to latching and applied #20NS. Suggest hand expressing into NS or using syringe to prefill. Baby has a clamped tight latch.  She sucked for approx 5 min and fell back asleep. Suggest mother watch hands on pumping video to help increase her milk supply. Limit feedings to 30 min including latching time.  After 10 min if baby is not sucking, give supplement. Praised parents for their efforts. Mother has personal DEBP at home.    Maternal Data    Feeding Feeding Type: Breast Fed  LATCH Score Latch: Repeated attempts needed to sustain latch, nipple held in mouth throughout feeding, stimulation needed to elicit sucking reflex.  Audible Swallowing: None  Type of Nipple: Everted at rest and after stimulation  Comfort (Breast/Nipple): Filling, red/small blisters or bruises, mild/mod discomfort  Hold (Positioning): No assistance needed to correctly position infant at breast.  LATCH Score: 6  Interventions Interventions: Breast feeding basics reviewed;DEBP;Skin to skin;Pre-pump if needed;Hand express  Lactation Tools Discussed/Used Tools: Pump;Nipple Shields Nipple shield size: 20 Flange Size: 24 Breast pump type: Double-Electric Breast Pump;Manual   Consult Status Consult Status: Follow-up Date: 04/26/19 Follow-up type: In-patient    [name] [facility] 04/25/2019, 1:27 PM

## 2019-04-25 NOTE — Progress Notes (Signed)
Patient ID: Kendra Meyer, female   DOB: 1990/12/27, 28 y.o.   MRN: [id number] PPD # 2 S/P VAVD  Live born female  Birth Weight: 5 lb 15.4 oz (2705 g) APGAR: 9, 9  Newborn Delivery   Birth date/time:  04/23/2019 18:59:00 Delivery type:  Vaginal, Vacuum (Extractor)    Baby name: [name] Delivering provider: Brien Few  Episiotomy:None   Lacerations:2nd degree   Feeding: breast and bottle  Pain control at delivery: Epidural   S:  Reports feeling tired but well, denies PEC s/sx             Tolerating po/ No nausea or vomiting             Bleeding is light             Pain controlled with ibuprofen (OTC)             Up ad lib / ambulatory / voiding without difficulties   O:  A & O x 3, in no apparent distress              VS:  Vitals:   04/24/19 2045 04/24/19 2356 04/25/19 0446 04/25/19 0842  BP: 127/70 133/86 (!) 142/85 131/88  Pulse: 77 74 (!) 58 77  Resp: 18 18 18 18   Temp: 98.2 F (36.8 C) 98.3 F (36.8 C) 97.8 F (36.6 C) 98.3 F (36.8 C)  TempSrc: Oral Oral Oral Oral  SpO2: 99% 97% 99% 99%  Weight:      Height:        LABS:  Recent Labs    04/24/19 0330 04/25/19 0515  WBC 25.2* 20.8*  HGB 9.8* 10.4*  HCT 28.4* 31.4*  PLT 190 202    Blood type: --/--/A POS (06/05 4431)  Rubella: Immune (12/04 0000)   I&O:  Intake/Output Summary (Last 24 hours) at 04/25/2019 1024 Last data filed at 04/25/2019 0600 Gross per 24 hour  Intake 3789.25 ml  Output 3900 ml  Net -110.75 ml    Vaccines: TDaP UTD         Flu    declined   Lungs: Clear and unlabored  Heart: regular rate and rhythm / no murmurs  Abdomen: soft, non-tender, non-distended             Fundus: firm, non-tender, U-2  Perineum: repair intact, no edema  Lochia: small  Extremities: trace edema, no calf pain or tenderness    A/P: PPD # 1 28 y.o., [id number]   Principal Problem:   Postpartum care following vaginal delivery 6/5 Active Problems:   Normal labor   Vacuum-assisted vaginal  delivery   Perineal laceration, second degree   Mild preeclampsia s/p Mag Sulfate seizure protection, improving, no neural sx, diuresing, BP mildly elevated, no severe range pressures  - labetalol 100 mg PO BID to continue  - improving, no neural sx, diuresing  Maternal anemia  - started oral Fe and Mag Ox Leukocytosis-imrpoved  - afebrile, uterus non-tender, no foul lochia  - continue observation,  Breastfeeding  - lactation support, first time mother  Encouraged rest when baby sleeping   Lovenia Kim, MSN, CNM 04/25/2019, 10:24 AM

## 2019-04-25 NOTE — Lactation Note (Signed)
This note was copied from a baby's chart. Lactation Consultation Note  Patient Name: Kendra Meyer BWIOM'B Date: 04/25/2019   Baby 54 hours old.  37 w < 6 lbs. Mother states she is latching her with #20NS but latch is hurting.  Baby fed 45 minutes ago so suggest mother call LC to help with next feeding. Mother is hand expressing and pumping w/ DEBP.  She is expressing more volume with hand expression. Mother has personal DEBP at home. Spoke to [employer] and family will call for [facility] to assist w/ next feeding.      Maternal Data    Feeding Feeding Type: Breast Fed  LATCH Score                   Interventions    Lactation Tools Discussed/Used Tools: Coconut oil;Nipple Shields Nipple shield size: 20 Flange Size: 24 Breast pump type: Double-Electric Breast Pump   Consult Status      [name] Boschen 04/25/2019, 11:44 AM

## 2019-04-26 MED ORDER — COCONUT OIL OIL: 1.0000 "application " | TOPICAL_OIL | 0 refills | Status: DC | PRN

## 2019-04-26 MED ORDER — POLYSACCHARIDE IRON COMPLEX 150 MG PO CAPS: 150.0000 mg | ORAL_CAPSULE | Freq: Every day | ORAL | 1 refills | Status: DC

## 2019-04-26 MED ORDER — LABETALOL HCL 100 MG PO TABS: 100.0000 mg | ORAL_TABLET | Freq: Three times a day (TID) | ORAL | Status: DC

## 2019-04-26 MED ORDER — LABETALOL HCL 100 MG PO TABS: 100.0000 mg | ORAL_TABLET | Freq: Three times a day (TID) | ORAL | 1 refills | Status: DC

## 2019-04-26 MED ORDER — ACETAMINOPHEN 325 MG PO TABS: 650.0000 mg | ORAL_TABLET | ORAL | Status: DC | PRN

## 2019-04-26 MED ORDER — IBUPROFEN 600 MG PO TABS: 600.0000 mg | ORAL_TABLET | Freq: Four times a day (QID) | ORAL | 0 refills | Status: DC

## 2019-04-26 MED ORDER — BENZOCAINE-MENTHOL 20-0.5 % EX AERO: 1.0000 "application " | INHALATION_SPRAY | CUTANEOUS | Status: DC | PRN

## 2019-04-26 MED ORDER — MAGNESIUM OXIDE 400 (241.3 MG) MG PO TABS: 400.0000 mg | ORAL_TABLET | Freq: Every day | ORAL | 3 refills | Status: DC

## 2019-04-26 NOTE — Progress Notes (Signed)
Patient discharged home with printed instructions. Pt verbalizes an understanding. No concerns noted. Toya Smothers, RN

## 2019-04-26 NOTE — Discharge Summary (Signed)
OB Discharge Summary  Patient Name: Kendra Meyer DOB: [birth year] MRN: [id number]  Date of admission: 04/23/2019 Delivering provider: Brien Few   Date of discharge: 04/26/2019  Admitting diagnosis: 56 WKS, LEAKING FLUIDS Intrauterine pregnancy: [redacted]w[redacted]d     Secondary diagnosis:Principal Problem:   Postpartum care following vaginal delivery 6/5 Active Problems:   Normal labor   Vacuum-assisted vaginal delivery   Perineal laceration, second degree   Severe preeclampsia  Additional problems:none     Discharge diagnosis:  Patient Active Problem List   Diagnosis Date Noted  . Postpartum care following vaginal delivery 6/5 04/24/2019  . Vacuum-assisted vaginal delivery 04/24/2019  . Perineal laceration, second degree 04/24/2019  . Severe preeclampsia 04/24/2019  . Normal labor 04/23/2019                                                                Post partum procedures:none  Augmentation: Pitocin Pain control: Epidural  Laceration:2nd degree  Episiotomy:None  Complications: None   Hospital course:  Onset of Labor With Vaginal Delivery     28 y.o. yo G2P0111 at [redacted]w[redacted]d was admitted in Latent Labor on 04/23/2019 with premature rupture of membranes. Patient had a labor course as follows: noted severe range blood pressure, was started on Magnesium Sulfate for neuro prophylaxis and augmented with pitocin for labor progress. Membrane Rupture Time/Date: 1:30 AM ,04/23/2019   Intrapartum Procedures: Episiotomy: None [1]                                         Lacerations:  2nd degree [3]  Patient had a delivery of a Viable infant. 04/23/2019  Information for the patient's newborn:  Jyrah, Blye [[id number]]  Delivery Method: vacuum assisted vaginal delivery    Pateint postpartum course as follows:   Magnesium Sulfate was continued for 24 hours postpartum. Labs were grossly normal, anemia noted and was started on oral iron replacement. Patient noted to have labile blood  pressure into mild range and was started on antihypertensive oral regimen with labetalol 100 mg PO BID on PP day 2, and increased dose to TID on PP day 3 prior to discharge. She is ambulating, tolerating a regular diet, passing flatus, and urinating well. Patient is discharged home in stable condition on 04/26/19.   Physical exam  Vitals:   04/25/19 2214 04/25/19 2317 04/26/19 0404 04/26/19 0746  BP: (!) 130/53 136/63 (!) 143/69 (!) 147/88  Pulse: 62 (!) 53 (!) 59 68  Resp:  18 18 18   Temp:  98.4 F (36.9 C) 97.9 F (36.6 C) 98.2 F (36.8 C)  TempSrc:  Oral Oral Oral  SpO2:  100% 100% 97%  Weight:      Height:       General: alert, cooperative and no distress Lochia: appropriate Uterine Fundus: firm Incision: Healing well with no significant drainage DVT Evaluation: No cords or calf tenderness. No significant calf/ankle edema. Labs: Lab Results  Component Value Date   WBC 20.8 (H) 04/25/2019   HGB 10.4 (L) 04/25/2019   HCT 31.4 (L) 04/25/2019   MCV 90.0 04/25/2019   PLT 202 04/25/2019   CMP Latest Ref Rng & Units 04/23/2019  Glucose 70 - 99  mg/dL 90  BUN 6 - 20 mg/dL 7  Creatinine 1.610.44 - 0.961.00 mg/dL 0.450.64  Sodium 409135 - 811145 mmol/L 136  Potassium 3.5 - 5.1 mmol/L 3.7  Chloride 98 - 111 mmol/L 105  CO2 22 - 32 mmol/L 21(L)  Calcium 8.9 - 10.3 mg/dL 9.5  Total Protein 6.5 - 8.1 g/dL 5.9(L)  Total Bilirubin 0.3 - 1.2 mg/dL 0.3  Alkaline Phos 38 - 126 U/L 139(H)  AST 15 - 41 U/L 18  ALT 0 - 44 U/L 18    Vaccines: TDaP UTD         Flu    declined  Discharge instruction: per After Visit Summary and "Baby and Me Booklet".  After Visit Meds:  Allergies as of 04/26/2019   No Known Allergies     Medication List    STOP taking these medications   NIFEdipine 10 MG capsule Commonly known as:  PROCARDIA     TAKE these medications   acetaminophen 325 MG tablet Commonly known as:  Tylenol Take 2 tablets (650 mg total) by mouth every 4 (four) hours as needed (for pain  scale < 4).   benzocaine-Menthol 20-0.5 % Aero Commonly known as:  DERMOPLAST Apply 1 application topically as needed for irritation (perineal discomfort).   coconut oil Oil Apply 1 application topically as needed.   cyclobenzaprine 10 MG tablet Commonly known as:  FLEXERIL Take 1 tablet (10 mg total) by mouth 3 (three) times daily as needed for muscle spasms.   ibuprofen 600 MG tablet Commonly known as:  ADVIL Take 1 tablet (600 mg total) by mouth every 6 (six) hours.   iron polysaccharides 150 MG capsule Commonly known as:  NIFEREX Take 1 capsule (150 mg total) by mouth daily. Start taking on:  April 27, 2019   labetalol 100 MG tablet Commonly known as:  NORMODYNE Take 1 tablet (100 mg total) by mouth 3 (three) times daily.   magnesium oxide 400 (241.3 Mg) MG tablet Commonly known as:  MAG-OX Take 1 tablet (400 mg total) by mouth daily. Start taking on:  April 27, 2019   prenatal multivitamin Tabs tablet Take 1 tablet by mouth daily at 12 noon.            Discharge Care Instructions  (From admission, onward)         Start     Ordered   04/26/19 0000  Discharge wound care:    Comments:  Sitz baths 2 times /day with warm water x 1 week. May add herbals: 1 ounce dried comfrey leaf* 1 ounce calendula flowers 1 ounce lavender flowers 1/2 ounce dried uva ursi leaves 1/2 ounce witch hazel blossoms (if you can find them) 1/2 ounce dried sage leaf 1/2 cup sea salt Directions: Bring 2 quarts of water to a boil. Turn off heat, and place 1 ounce (approximately 1 large handful) of the above mixed herbs (not the salt) into the pot. Steep, covered, for 30 minutes.  Strain the liquid well with a fine mesh strainer, and discard the herb material. Add 2 quarts of liquid to the tub, along with the 1/2 cup of salt. This medicinal liquid can also be made into compresses and peri-rinses.   04/26/19 0948          Diet: routine diet  Activity: Advance as tolerated. Pelvic  rest for 6 weeks.   Postpartum contraception: Not Discussed  Newborn Data: Live born female  Birth Weight: 5 lb 15.4 oz (2705 g) APGAR: 9, 9  Newborn Delivery   Birth date/time:  04/23/2019 18:59:00 Delivery type:  Vaginal, Vacuum (Extractor)     named [name] Baby Feeding: Breast Disposition:home with mother   Delivery Report:  Review the Delivery Report for details.    Follow up: Follow-up Information    Amado NashAlmquist, Candace GallusSusan E, MD. Schedule an appointment as soon as possible for a visit in 1 week(s).   Specialty:  Obstetrics and Gynecology Why:  BP check Contact information: [address] [place] [address] [phone]             Signed: Cipriano MileDaniela C Kateland Leisinger, CNM, MSN 04/26/2019, 9:49 AM

## 2019-04-26 NOTE — Lactation Note (Signed)
This note was copied from a baby's chart. Lactation Consultation Note  Patient Name: Kendra Meyer FVCBS'W Date: 04/26/2019 Reason for consult: Follow-up assessment;Infant < 6lbs;Primapara;Late-preterm 34-36.6wks Baby is 61 hours/4% weight loss.  Mom is putting baby to breast each feeding using a 20 mm nipple shield.  Parents are using a curved tip syringe to fill shield with expressed milk.  Observed baby latch well and suck actively.  Mom post pumping and obtaining 10 mls.  Baby is supplemented with expressed milk and formula using slow flow nipple.  Mom will continue feeding plan at home.  Recommended an outpatient appointment in one week for a feeding assessment and plan.  Discussed milk coming to volume and the prevention and treatment of engorgement.  Mom has a DEBP at home.  Questions answered.  Encouraged to call prn.  Maternal Data Formula Feeding for Exclusion: No  Feeding Feeding Type: Breast Fed  LATCH Score Latch: Grasps breast easily, tongue down, lips flanged, rhythmical sucking.  Audible Swallowing: A few with stimulation  Type of Nipple: Flat  Comfort (Breast/Nipple): Soft / non-tender  Hold (Positioning): No assistance needed to correctly position infant at breast.  LATCH Score: 8  Interventions    Lactation Tools Discussed/Used Tools: Nipple Shields Nipple shield size: 20   Consult Status Consult Status: Complete Follow-up type: Call as needed    Ave Filter 04/26/2019, 8:26 AM

## 2019-04-26 NOTE — Progress Notes (Signed)
Patient ID: Kendra Meyer, female   DOB: 01-Dec-1990, 28 y.o.   MRN: [id number] Post Partum Day #3  S/P NSVB / severe PEC Live born female  Birth Weight: 5 lb 15.4 oz (2705 g) APGAR: 9, 9  Newborn Delivery   Birth date/time:  04/23/2019 18:59:00 Delivery type:  Vaginal, Vacuum (Extractor)    Named Kendra Meyer Delivering provider: Brien Few   Feeding: breast  Pain control at delivery: Epidural   Subjective: No HA, SOB, CP, breast symptoms.  Pain minimal.  Normal vaginal bleeding, no clots.   Voiding freely.    Objective:  VS:  Vitals:   04/25/19 2214 04/25/19 2317 04/26/19 0404 04/26/19 0746  BP: (!) 130/53 136/63 (!) 143/69 (!) 147/88  Pulse: 62 (!) 53 (!) 59 68  Resp:  18 18 18   Temp:  98.4 F (36.9 C) 97.9 F (36.6 C) 98.2 F (36.8 C)  TempSrc:  Oral Oral Oral  SpO2:  100% 100% 97%  Weight:      Height:         Intake/Output Summary (Last 24 hours) at 04/26/2019 0850 Last data filed at 04/25/2019 1800 Gross per 24 hour  Intake -  Output 800 ml  Net -800 ml      Recent Labs    04/24/19 0330 04/25/19 0515  WBC 25.2* 20.8*  HGB 9.8* 10.4*  HCT 28.4* 31.4*  PLT 190 202    Blood type: --/--/A POS (06/05 5809) Rubella: Immune (12/04 0000)  Vaccines: TDaP UTD         Flu    declined  Physical Exam:  General: alert, cooperative and no distress Uterine Fundus: firm Lochia: appropriate Perineum: repair intact, no edema DVT Evaluation: No cords or calf tenderness. No significant calf/ankle edema.    Assessment/Plan: PPD # 3 / 28 y.o., G2P0111    Principal Problem:   Postpartum care following vaginal delivery 6/5 Active Problems:   Normal labor   Vacuum-assisted vaginal delivery   Perineal laceration, second degree   Severe preeclampsia  - BP labile to mild range on labetalol 100 mg PO BID started yesterday  - Increase labetalol TID  - close f/u in office 1 week  - PEC precautions reinforced    normal postpartum exam  Continue current  postpartum care             DC home today w/ instructions  F/U at [place] in 1 week and PRN   LOS: 3 days   Juliene Pina, CNM, MSN 04/26/2019, 8:50 AM

## 2019-04-27 ENCOUNTER — Other Ambulatory Visit (HOSPITAL_COMMUNITY)
Admission: RE | Admit: 2019-04-27 | Discharge: 2019-04-27 | Disposition: A | Discharge status: Home / Self Care | Payer: Managed Care, Other (non HMO) | Source: Ambulatory Visit

## 2019-04-27 NOTE — Anesthesia Postprocedure Evaluation (Signed)
Anesthesia Post Note  Patient: Kendra Meyer  Procedure(s) Performed: AN AD HOC LABOR EPIDURAL  Per chart review- patient discharged home with no issues, noted to be ambulating with no mention of back pain or headache.    Patient location during evaluation: Mother Baby Anesthesia Type: Epidural Level of consciousness: awake and alert Pain management: pain level controlled Vital Signs Assessment: post-procedure vital signs reviewed and stable Respiratory status: spontaneous breathing, nonlabored ventilation and respiratory function stable Cardiovascular status: blood pressure returned to baseline and stable Postop Assessment: no headache, no backache and able to ambulate Anesthetic complications: no    Last Vitals: There were no vitals filed for this visit.  Last Pain: There were no vitals filed for this visit.               Lidia Collum

## 2019-04-29 ENCOUNTER — Inpatient Hospital Stay (HOSPITAL_COMMUNITY): Payer: Managed Care, Other (non HMO)

## 2020-11-18 NOTE — L&D Delivery Note (Signed)
Delivery Note:   RN called CNM at 1522 stating patient was complete with strong urge to push. Attending physician in OR and CNM was asked to stand by for delivery.  [id number] at [redacted]w[redacted]d  Admitting diagnosis: Encounter for induction of labor [Z34.90] Risks: gHTN, pyelonephritis during pregnancy Onset of labor: 10/16/2021 1234 IOL/Augmentation: AROM and Pitocin ROM: 10/06/2021 at 0944, clear fluid  Complete dilation at 10/16/2021  1522 Onset of pushing at 1530 FHR second stage Cat II,variables with pushing, moderate variability  Analgesia /Anesthesia intrapartum:Epidural  Pushing in lithotomy position with CNM and L&D staff support. Husband, Eliberto Ivory, present for birth and supportive.  Delivery of a Live born female  Birth Weight:   APGAR: 8, 9  Newborn Delivery   Birth date/time: 10/16/2021 15:41:00 Delivery type: Vaginal, Spontaneous      in cephalic presentation, position OA to ROA.  APGAR:1 min-8 , 5 min-9   Nuchal Cord: No  Cord double clamped after cessation of pulsation, cut by [facility].  Collection of cord blood for typing completed. Cord blood donation-None  Arterial cord blood sample-No    Placenta delivered-Spontaneous  with 3 vessels . Uterotonics: Pitocin Placenta to L&D Uterine tone firm  Bleeding scant  1st degree  laceration identified.  Episiotomy:None  Local analgesia: N/A  Repair: 3-0 Vicryl in usual fashion with excellent hemostasis Est. Blood Loss (mL):50.00   Complications: None  Mom to postpartum.  Baby girl to Couplet care / Skin to Skin.  Delivery Report:  Review the Delivery Report for details.    June Leap, CNM, MSN 10/16/2021, 4:06 PM

## 2021-04-13 DIAGNOSIS — Z8759 Personal history of other complications of pregnancy, childbirth and the puerperium: Secondary | ICD-10-CM | POA: Insufficient documentation

## 2021-04-13 LAB — OB RESULTS CONSOLE GC/CHLAMYDIA
Chlamydia: NEGATIVE
Gonorrhea: NEGATIVE

## 2021-04-13 LAB — OB RESULTS CONSOLE HEPATITIS B SURFACE ANTIGEN: Hepatitis B Surface Ag: NEGATIVE

## 2021-04-13 LAB — OB RESULTS CONSOLE HIV ANTIBODY (ROUTINE TESTING): HIV: NONREACTIVE

## 2021-04-13 LAB — OB RESULTS CONSOLE RUBELLA ANTIBODY, IGM: Rubella: IMMUNE

## 2021-04-13 LAB — OB RESULTS CONSOLE RPR: RPR: NONREACTIVE

## 2021-08-21 ENCOUNTER — Telehealth: Payer: Self-pay | Admitting: Hematology and Oncology

## 2021-08-21 NOTE — Telephone Encounter (Signed)
Scheduled appt per 10/3 referral. Pt is aware of appt date and time.  

## 2021-08-22 ENCOUNTER — Inpatient Hospital Stay: Payer: Managed Care, Other (non HMO)

## 2021-08-22 ENCOUNTER — Inpatient Hospital Stay: Payer: Managed Care, Other (non HMO) | Attending: Hematology and Oncology | Admitting: Hematology and Oncology

## 2021-08-22 ENCOUNTER — Encounter: Payer: Self-pay | Admitting: Hematology and Oncology

## 2021-08-22 ENCOUNTER — Other Ambulatory Visit: Payer: Self-pay

## 2021-08-22 VITALS — BP 125/63 | HR 65 | Temp 97.5°F | Resp 17 | Ht 64.5 in | Wt 172.9 lb

## 2021-08-22 DIAGNOSIS — Z803 Family history of malignant neoplasm of breast: Secondary | ICD-10-CM

## 2021-08-22 DIAGNOSIS — D72825 Bandemia: Secondary | ICD-10-CM

## 2021-08-22 DIAGNOSIS — Z800 Family history of malignant neoplasm of digestive organs: Secondary | ICD-10-CM | POA: Diagnosis not present

## 2021-08-22 DIAGNOSIS — Z79899 Other long term (current) drug therapy: Secondary | ICD-10-CM | POA: Diagnosis not present

## 2021-08-22 DIAGNOSIS — Z8616 Personal history of COVID-19: Secondary | ICD-10-CM | POA: Insufficient documentation

## 2021-08-22 DIAGNOSIS — D72829 Elevated white blood cell count, unspecified: Secondary | ICD-10-CM | POA: Diagnosis not present

## 2021-08-22 DIAGNOSIS — Z808 Family history of malignant neoplasm of other organs or systems: Secondary | ICD-10-CM | POA: Diagnosis not present

## 2021-08-22 DIAGNOSIS — Z3A29 29 weeks gestation of pregnancy: Secondary | ICD-10-CM | POA: Insufficient documentation

## 2021-08-22 LAB — CBC WITH DIFFERENTIAL/PLATELET
Abs Immature Granulocytes: 0.12 10*3/uL — ABNORMAL HIGH (ref 0.00–0.07)
Basophils Absolute: 0 10*3/uL (ref 0.0–0.1)
Basophils Relative: 0 %
Eosinophils Absolute: 0.3 10*3/uL (ref 0.0–0.5)
Eosinophils Relative: 2 %
HCT: 31.8 % — ABNORMAL LOW (ref 36.0–46.0)
Hemoglobin: 11 g/dL — ABNORMAL LOW (ref 12.0–15.0)
Immature Granulocytes: 1 %
Lymphocytes Relative: 12 %
Lymphs Abs: 2.3 10*3/uL (ref 0.7–4.0)
MCH: 30 pg (ref 26.0–34.0)
MCHC: 34.6 g/dL (ref 30.0–36.0)
MCV: 86.6 fL (ref 80.0–100.0)
Monocytes Absolute: 1 10*3/uL (ref 0.1–1.0)
Monocytes Relative: 6 %
Neutro Abs: 14.6 10*3/uL — ABNORMAL HIGH (ref 1.7–7.7)
Neutrophils Relative %: 79 %
Platelets: 237 10*3/uL (ref 150–400)
RBC: 3.67 MIL/uL — ABNORMAL LOW (ref 3.87–5.11)
RDW: 13.6 % (ref 11.5–15.5)
WBC: 18.3 10*3/uL — ABNORMAL HIGH (ref 4.0–10.5)
nRBC: 0 % (ref 0.0–0.2)

## 2021-08-22 LAB — COMPREHENSIVE METABOLIC PANEL
ALT: 12 U/L (ref 0–44)
AST: 11 U/L — ABNORMAL LOW (ref 15–41)
Albumin: 3.1 g/dL — ABNORMAL LOW (ref 3.5–5.0)
Alkaline Phosphatase: 92 U/L (ref 38–126)
Anion gap: 10 (ref 5–15)
BUN: 8 mg/dL (ref 6–20)
CO2: 24 mmol/L (ref 22–32)
Calcium: 9.4 mg/dL (ref 8.9–10.3)
Chloride: 104 mmol/L (ref 98–111)
Creatinine, Ser: 0.67 mg/dL (ref 0.44–1.00)
GFR, Estimated: >60 mL/min (ref >60)
Glucose, Bld: 95 mg/dL (ref 70–99)
Potassium: 3.7 mmol/L (ref 3.5–5.1)
Sodium: 138 mmol/L (ref 135–145)
Total Bilirubin: 0.3 mg/dL (ref 0.3–1.2)
Total Protein: 6.5 g/dL (ref 6.5–8.1)

## 2021-08-22 NOTE — Progress Notes (Signed)
[facility] CONSULT NOTE  Patient Care Team: Associates, [facility] as PCP - General (Family Medicine) Obgyn, Ma Hillock as PCP - OBGYN  CHIEF COMPLAINTS/PURPOSE OF CONSULTATION:  Leukocytosis.  ASSESSMENT & PLAN:  No problem-specific Assessment & Plan notes found for this encounter.  Orders Placed This Encounter  Procedures   CBC with Differential/Platelet    Standing Status:   Standing    Number of Occurrences:   22    Standing Expiration Date:   08/22/2022   Pathologist smear review    Standing Status:   Future    Number of Occurrences:   1    Standing Expiration Date:   08/22/2022   Comprehensive metabolic panel    Standing Status:   Standing    Number of Occurrences:   33    Standing Expiration Date:   08/22/2022    This is a very pleasant 30 year old female patient with [redacted] weeks gestation referred to hematology for evaluation of leukocytosis, predominantly neutrophilia.  Patient denies any new health complaints.  She denies any recent infections, B symptoms, steroid use, sinus issues.  Physical examination healthy-appearing female patient with no concerns. Have reviewed her labs which showed leukocytosis predominantly neutrophilia, no lymphocytosis noted.  No thrombocytosis noted.  This could be normal physiologic change in pregnancy.  Usually leukocytosis is marked in the second trimester and starts plateauing in the third trimester and normalizes about a week after postpartum. Since she is clinically asymptomatic, if white blood cell count remains stable, she might be able to just proceed with surveillance.  We will repeat CBC, CMP and smear review today.  She will be seen back in our clinic in about 4 weeks. She agrees and expresses understanding of the plan.  Thank you for consulting [place] in the care of this patient.  Please do not hesitate to contact us with any additional questions or concerns.  HISTORY OF PRESENTING ILLNESS:  Kendra Meyer 30 y.o. female is here because of Leukocytosis.  This is a very pleasant 30 year old female patient currently at 97 weeks of gestation referred to hematology for evaluation of leukocytosis.  Patient had labs back in May 2022 which showed a white count of 12,000, repeat labs in early September showed a white count of 18,200 with neutrophilia.  Most recent labs from end of September showed a white count of 18,000 again with neutrophilia.  No anemia or polycythemia or thrombocytosis or thrombocytopenia.  No fevers, infections, recent steroid use.  She had COVID back in May 2022.  No autoimmune diseases.  She currently continues on hydroxyprogesterone.  She denies any complaints except for pregnancy related issues.  No preeclampsia with this pregnancy.  She denies any hematological issues with her prior pregnancy. Rest of the pertinent 10 point ROS reviewed and negative.  REVIEW OF SYSTEMS:   Constitutional: Denies fevers, chills or abnormal night sweats Eyes: Denies blurriness of vision, double vision or watery eyes Ears, nose, mouth, throat, and face: Denies mucositis or sore throat Respiratory: Denies cough, dyspnea or wheezes Cardiovascular: Denies palpitation, chest discomfort or lower extremity swelling Gastrointestinal:  Denies nausea, heartburn or change in bowel habits Skin: Denies abnormal skin rashes Lymphatics: Denies new lymphadenopathy or easy bruising Neurological:Denies numbness, tingling or new weaknesses Behavioral/Psych: Mood is stable, no new changes  All other systems were reviewed with the patient and are negative.  MEDICAL HISTORY:  Past Medical History:  Diagnosis Date   Headache    Hypertension    Medical  history non-contributory     SURGICAL HISTORY: Past Surgical History:  Procedure Laterality Date   COLONOSCOPY     NO PAST SURGERIES      SOCIAL HISTORY: Social History   Socioeconomic History   Marital status: Married    Spouse name: Not on file    Number of children: Not on file   Years of education: Not on file   Highest education level: Not on file  Occupational History   Not on file  Tobacco Use   Smoking status: Never   Smokeless tobacco: Never  Vaping Use   Vaping Use: Never used  Substance and Sexual Activity   Alcohol use: Never   Drug use: Never   Sexual activity: Yes  Other Topics Concern   Not on file  Social History Narrative   Not on file   Social Determinants of Health   Financial Resource Strain: Not on file  Food Insecurity: Not on file  Transportation Needs: Not on file  Physical Activity: Not on file  Stress: Not on file  Social Connections: Not on file  Intimate Partner Violence: Not on file    FAMILY HISTORY: Family History  Problem Relation Age of Onset   Cancer Father        colon   Cancer Paternal Grandmother        breast   Cancer Paternal Grandfather        esophageal   Alcohol abuse Brother     ALLERGIES:  has No Known Allergies.  MEDICATIONS:  Current Outpatient Medications  Medication Sig Dispense Refill   BABY ASPIRIN PO Take 81 mg by mouth daily.     hydroxyprogesterone caproate (MAKENA) 250 mg/mL OIL injection Inject 250 mg into the muscle once a week.     Prenatal Vit-Fe Fumarate-FA (PRENATAL MULTIVITAMIN) TABS tablet Take 1 tablet by mouth daily at 12 noon.     No current facility-administered medications for this visit.    PHYSICAL EXAMINATION: ECOG PERFORMANCE STATUS: 0 - Asymptomatic  Vitals:   08/22/21 1324  BP: 125/63  Pulse: 65  Resp: 17  Temp: (!) 97.5 F (36.4 C)  SpO2: 100%   Filed Weights   08/22/21 1324  Weight: 172 lb 14.4 oz (78.4 kg)    GENERAL:alert, no distress and comfortable SKIN: skin color, texture, turgor are normal, no rashes or significant lesions EYES: normal, conjunctiva are pink and non-injected, sclera clear OROPHARYNX:no exudate, no erythema and lips, buccal mucosa, and tongue normal  NECK: supple, thyroid normal size,  non-tender, without nodularity LYMPH:  no palpable lymphadenopathy in the cervical, axillary or inguinal LUNGS: clear to auscultation and percussion with normal breathing effort HEART: regular rate & rhythm and no murmurs and no lower extremity edema ABDOMEN:Gravid uterus, no splenomegaly. Musculoskeletal:no cyanosis of digits and no clubbing  PSYCH: alert & oriented x 3 with fluent speech NEURO: no focal motor/sensory deficits  LABORATORY DATA:  I have reviewed the data as listed Lab Results  Component Value Date   WBC 18.3 (H) 08/22/2021   HGB 11.0 (L) 08/22/2021   HCT 31.8 (L) 08/22/2021   MCV 86.6 08/22/2021   PLT 237 08/22/2021     Chemistry      Component Value Date/Time   NA 136 04/23/2019 0433   K 3.7 04/23/2019 0433   CL 105 04/23/2019 0433   CO2 21 (L) 04/23/2019 0433   BUN 7 04/23/2019 0433   CREATININE 0.64 04/23/2019 0433      Component Value Date/Time  CALCIUM 9.5 04/23/2019 0433   ALKPHOS 139 (H) 04/23/2019 0433   AST 18 04/23/2019 0433   ALT 18 04/23/2019 0433   BILITOT 0.3 04/23/2019 0433     I have reviewed pertinent labs.  RADIOGRAPHIC STUDIES: I have personally reviewed the radiological images as listed and agreed with the findings in the report. No results found.  All questions were answered. The patient knows to call the clinic with any problems, questions or concerns. I spent 30 minutes in the care of this patient including H and P, review of records, counseling and coordination of care.     Rachel Moulds, MD 08/22/2021 2:16 PM

## 2021-08-23 LAB — PATHOLOGIST SMEAR REVIEW

## 2021-08-29 ENCOUNTER — Other Ambulatory Visit: Payer: Self-pay

## 2021-08-29 ENCOUNTER — Encounter (HOSPITAL_COMMUNITY): Payer: Self-pay | Admitting: Obstetrics

## 2021-08-29 ENCOUNTER — Inpatient Hospital Stay (HOSPITAL_COMMUNITY): Payer: Managed Care, Other (non HMO)

## 2021-08-29 ENCOUNTER — Inpatient Hospital Stay (HOSPITAL_COMMUNITY)
Admission: AD | Admit: 2021-08-29 | Discharge: 2021-09-01 | DRG: 832 | Disposition: A | Discharge status: Home / Self Care | Payer: Managed Care, Other (non HMO) | Attending: Obstetrics & Gynecology | Admitting: Obstetrics & Gynecology

## 2021-08-29 DIAGNOSIS — O99013 Anemia complicating pregnancy, third trimester: Secondary | ICD-10-CM | POA: Diagnosis present

## 2021-08-29 DIAGNOSIS — O99891 Other specified diseases and conditions complicating pregnancy: Secondary | ICD-10-CM | POA: Diagnosis present

## 2021-08-29 DIAGNOSIS — O368330 Maternal care for abnormalities of the fetal heart rate or rhythm, third trimester, not applicable or unspecified: Secondary | ICD-10-CM | POA: Diagnosis present

## 2021-08-29 DIAGNOSIS — Z3A30 30 weeks gestation of pregnancy: Secondary | ICD-10-CM

## 2021-08-29 DIAGNOSIS — N1330 Unspecified hydronephrosis: Secondary | ICD-10-CM | POA: Diagnosis present

## 2021-08-29 DIAGNOSIS — O2303 Infections of kidney in pregnancy, third trimester: Secondary | ICD-10-CM | POA: Diagnosis present

## 2021-08-29 DIAGNOSIS — R109 Unspecified abdominal pain: Secondary | ICD-10-CM | POA: Diagnosis present

## 2021-08-29 DIAGNOSIS — Z20822 Contact with and (suspected) exposure to covid-19: Secondary | ICD-10-CM | POA: Diagnosis present

## 2021-08-29 DIAGNOSIS — N10 Acute tubulo-interstitial nephritis: Secondary | ICD-10-CM | POA: Diagnosis present

## 2021-08-29 LAB — URINALYSIS, ROUTINE W REFLEX MICROSCOPIC
Bilirubin Urine: NEGATIVE
Glucose, UA: NEGATIVE mg/dL
Hgb urine dipstick: NEGATIVE
Ketones, ur: 5 mg/dL — AB
Nitrite: POSITIVE — AB
Protein, ur: 30 mg/dL — AB
Specific Gravity, Urine: 1.017 (ref 1.005–1.030)
WBC, UA: >50 WBC/hpf — ABNORMAL HIGH (ref 0–5)
pH: 8 (ref 5.0–8.0)

## 2021-08-29 LAB — TYPE AND SCREEN
ABO/RH(D): A POS
Antibody Screen: NEGATIVE

## 2021-08-29 LAB — COMPREHENSIVE METABOLIC PANEL
ALT: 16 U/L (ref 0–44)
AST: 16 U/L (ref 15–41)
Albumin: 3 g/dL — ABNORMAL LOW (ref 3.5–5.0)
Alkaline Phosphatase: 99 U/L (ref 38–126)
Anion gap: 10 (ref 5–15)
BUN: 8 mg/dL (ref 6–20)
CO2: 22 mmol/L (ref 22–32)
Calcium: 8.5 mg/dL — ABNORMAL LOW (ref 8.9–10.3)
Chloride: 104 mmol/L (ref 98–111)
Creatinine, Ser: 0.66 mg/dL (ref 0.44–1.00)
GFR, Estimated: >60 mL/min (ref >60)
Glucose, Bld: 97 mg/dL (ref 70–99)
Potassium: 3.6 mmol/L (ref 3.5–5.1)
Sodium: 136 mmol/L (ref 135–145)
Total Bilirubin: 0.6 mg/dL (ref 0.3–1.2)
Total Protein: 6.5 g/dL (ref 6.5–8.1)

## 2021-08-29 LAB — CBC WITH DIFFERENTIAL/PLATELET
Abs Immature Granulocytes: 0 10*3/uL (ref 0.00–0.07)
Basophils Absolute: 0 10*3/uL (ref 0.0–0.1)
Basophils Relative: 0 %
Eosinophils Absolute: 0 10*3/uL (ref 0.0–0.5)
Eosinophils Relative: 0 %
HCT: 33.9 % — ABNORMAL LOW (ref 36.0–46.0)
Hemoglobin: 11.8 g/dL — ABNORMAL LOW (ref 12.0–15.0)
Lymphocytes Relative: 8 %
Lymphs Abs: 2 10*3/uL (ref 0.7–4.0)
MCH: 30.5 pg (ref 26.0–34.0)
MCHC: 34.8 g/dL (ref 30.0–36.0)
MCV: 87.6 fL (ref 80.0–100.0)
Monocytes Absolute: 0.8 10*3/uL (ref 0.1–1.0)
Monocytes Relative: 3 %
Neutro Abs: 22.3 10*3/uL — ABNORMAL HIGH (ref 1.7–7.7)
Neutrophils Relative %: 89 %
Platelets: 216 10*3/uL (ref 150–400)
RBC: 3.87 MIL/uL (ref 3.87–5.11)
RDW: 13.8 % (ref 11.5–15.5)
WBC: 25.1 10*3/uL — ABNORMAL HIGH (ref 4.0–10.5)
nRBC: 0 % (ref 0.0–0.2)
nRBC: 0 /100 WBC

## 2021-08-29 LAB — RESP PANEL BY RT-PCR (FLU A&B, COVID) ARPGX2
Influenza A by PCR: NEGATIVE
Influenza B by PCR: NEGATIVE
SARS Coronavirus 2 by RT PCR: NEGATIVE

## 2021-08-29 MED ORDER — PRENATAL MULTIVITAMIN CH
1.0000 | ORAL_TABLET | Freq: Every day | ORAL | Status: DC
  Administered 2021-08-30 – 2021-09-01 (×3): 1 via ORAL
  Filled 2021-08-29 (×3): qty 1

## 2021-08-29 MED ORDER — TAMSULOSIN HCL 0.4 MG PO CAPS
0.4000 mg | ORAL_CAPSULE | Freq: Every day | ORAL | Status: DC
  Administered 2021-08-29 – 2021-09-01 (×4): 0.4 mg via ORAL
  Filled 2021-08-29 (×4): qty 1

## 2021-08-29 MED ORDER — ACETAMINOPHEN 325 MG PO TABS
650.0000 mg | ORAL_TABLET | Freq: Four times a day (QID) | ORAL | Status: DC | PRN
  Administered 2021-08-29 (×2): 650 mg via ORAL
  Filled 2021-08-29 (×2): qty 2

## 2021-08-29 MED ORDER — HYDROMORPHONE HCL 1 MG/ML IJ SOLN
0.5000 mg | Freq: Once | INTRAMUSCULAR | Status: AC
  Administered 2021-08-29: 0.5 mg via INTRAVENOUS
  Filled 2021-08-29: qty 1

## 2021-08-29 MED ORDER — HYDROMORPHONE HCL 1 MG/ML IJ SOLN
0.5000 mg | INTRAMUSCULAR | Status: DC | PRN
Start: 2021-08-29 — End: 2021-09-01
  Administered 2021-08-29 – 2021-08-31 (×8): 0.5 mg via INTRAVENOUS
  Filled 2021-08-29: qty 1
  Filled 2021-08-29 (×7): qty 0.5

## 2021-08-29 MED ORDER — SODIUM CHLORIDE 0.9 % IV SOLN
25.0000 mg | Freq: Four times a day (QID) | INTRAVENOUS | Status: DC | PRN
  Administered 2021-08-29: 25 mg via INTRAVENOUS
  Filled 2021-08-29 (×3): qty 1

## 2021-08-29 MED ORDER — ASPIRIN EC 81 MG PO TBEC
81.0000 mg | DELAYED_RELEASE_TABLET | Freq: Every day | ORAL | Status: DC
  Administered 2021-08-30 – 2021-09-01 (×3): 81 mg via ORAL
  Filled 2021-08-29 (×3): qty 1

## 2021-08-29 MED ORDER — ONDANSETRON HCL 4 MG/2ML IJ SOLN
4.0000 mg | Freq: Once | INTRAMUSCULAR | Status: AC
  Administered 2021-08-29: 4 mg via INTRAVENOUS
  Filled 2021-08-29: qty 2

## 2021-08-29 MED ORDER — ACETAMINOPHEN 325 MG PO TABS: 650.0000 mg | ORAL_TABLET | ORAL | Status: DC | PRN

## 2021-08-29 MED ORDER — CALCIUM CARBONATE ANTACID 500 MG PO CHEW
2.0000 | CHEWABLE_TABLET | ORAL | Status: DC | PRN
  Administered 2021-08-31: 400 mg via ORAL
  Filled 2021-08-29: qty 2

## 2021-08-29 MED ORDER — DOCUSATE SODIUM 100 MG PO CAPS
100.0000 mg | ORAL_CAPSULE | Freq: Every day | ORAL | Status: DC
  Administered 2021-08-30 – 2021-09-01 (×3): 100 mg via ORAL
  Filled 2021-08-29 (×3): qty 1

## 2021-08-29 MED ORDER — SODIUM CHLORIDE 0.9 % IV SOLN
2.0000 g | INTRAVENOUS | Status: DC
  Administered 2021-08-29 – 2021-08-31 (×3): 2 g via INTRAVENOUS
  Filled 2021-08-29 (×4): qty 20

## 2021-08-29 MED ORDER — LACTATED RINGERS IV BOLUS
1000.0000 mL | Freq: Once | INTRAVENOUS | Status: AC
  Administered 2021-08-29: 1000 mL via INTRAVENOUS

## 2021-08-29 MED ORDER — LACTATED RINGERS IV SOLN
INTRAVENOUS | Status: DC
  Administered 2021-09-01: 125 mL/h via INTRAVENOUS

## 2021-08-29 MED ORDER — ZOLPIDEM TARTRATE 5 MG PO TABS
5.0000 mg | ORAL_TABLET | Freq: Every evening | ORAL | Status: DC | PRN
  Administered 2021-08-30 – 2021-08-31 (×2): 5 mg via ORAL
  Filled 2021-08-29 (×3): qty 1

## 2021-08-29 NOTE — Progress Notes (Signed)
Patient ID: Kendra Meyer, female   DOB: 1991/09/23, 30 y.o.   MRN: [id number] Left flank pain getting worse, radiating to front and groin and vomited twice.  No h/o renal stone but hematuria and no fever may be renal stone. Will continue Abx for presumed pyelo but start Flomax and get renal sono, may not see stone but can assess hydronephrosis  Phenergan RTC and Dilaudid q 3 hrs/ prn pain

## 2021-08-29 NOTE — H&P (Addendum)
Kendra Meyer is a 30 y.o. female presenting for LEFT back pain, vomiting and diarrhea. No fever/ chills/ dysuria or h/o renal stones. H/o UTI 2 months back  [facility]care at [facility] infection in 1st trim Isolated EIF, otherwise nl anato and NIPT   Prior h/o preeclampsia and preterm contractions from 33 wks and delivery at 36 wks, is on Makena, every Thursday   OB History     Gravida  3   Para  1   Term      Preterm  1   AB  1   Living  1      SAB  1   IAB      Ectopic      Multiple  0   Live Births  1          Past Medical History:  Diagnosis Date   Headache    Hypertension    Medical history non-contributory    Past Surgical History:  Procedure Laterality Date   COLONOSCOPY     NO PAST SURGERIES     Family History: family history includes Alcohol abuse in her brother; Cancer in her father, paternal grandfather, and paternal grandmother. Social History:  reports that she has never smoked. She has never used smokeless tobacco. She reports that she does not drink alcohol and does not use drugs.   Review of Systems History Dilation: Closed Effacement (%): Thick Station: Ballotable Exam by:: Hogan,CNM Blood pressure 129/75, pulse 86, temperature 98.2 F (36.8 C), temperature source Oral, resp. rate 15, SpO2 100 %, unknown if currently breastfeeding. Exam Physical Exam  Physical exam:  A&O x 3, no acute distress. Pleasant HEENT neg, no thyromegaly Lungs CTA bilat CV RRR, S1S2 normal Abdo soft, non tender, non acute, gravid, relaxed  Extr no edema/ tenderness Pelvic per MAU CNM, so I did not repeat  FHT 140s + accels no decels mod variability-reactive  Toco none   CBC    Component Value Date/Time   WBC 25.1 (H) 08/29/2021 1259   RBC 3.87 08/29/2021 1259   HGB 11.8 (L) 08/29/2021 1259   HCT 33.9 (L) 08/29/2021 1259   PLT 216 08/29/2021 1259   MCV 87.6 08/29/2021 1259   MCH 30.5 08/29/2021 1259   MCHC 34.8 08/29/2021 1259   RDW 13.8  08/29/2021 1259   LYMPHSABS 2.0 08/29/2021 1259   MONOABS 0.8 08/29/2021 1259   EOSABS 0.0 08/29/2021 1259   BASOSABS 0.0 08/29/2021 1259    CMP     Component Value Date/Time   NA 136 08/29/2021 1259   K 3.6 08/29/2021 1259   CL 104 08/29/2021 1259   CO2 22 08/29/2021 1259   GLUCOSE 97 08/29/2021 1259   BUN 8 08/29/2021 1259   CREATININE 0.66 08/29/2021 1259   CALCIUM 8.5 (L) 08/29/2021 1259   PROT 6.5 08/29/2021 1259   ALBUMIN 3.0 (L) 08/29/2021 1259   AST 16 08/29/2021 1259   ALT 16 08/29/2021 1259   ALKPHOS 99 08/29/2021 1259   BILITOT 0.6 08/29/2021 1259   GFRNONAA >60 08/29/2021 1259   GFRAA >60 04/23/2019 0433    Urinalysis    Component Value Date/Time   COLORURINE YELLOW 08/29/2021 1208   APPEARANCEUR CLOUDY (A) 08/29/2021 1208   LABSPEC 1.017 08/29/2021 1208   PHURINE 8.0 08/29/2021 1208   GLUCOSEU NEGATIVE 08/29/2021 1208   HGBUR NEGATIVE 08/29/2021 1208   BILIRUBINUR NEGATIVE 08/29/2021 1208   KETONESUR 5 (A) 08/29/2021 1208   PROTEINUR 30 (A) 08/29/2021 1208  NITRITE POSITIVE (A) 08/29/2021 1208   LEUKOCYTESUR LARGE (A) 08/29/2021 1208      Assessment/Plan: 30 yo [id number], at 30 wks with left flank pain and UA c/w acute pyelonephritis  -Ceftriaxone IV 2 gm q 24 hrs  Dilaudid 0.5 mg q 3 hrs/prn pain Phenergan 25 mg q 6 hrs prn n/v  IV fluids Diet as tolerated UCx sent from MAU, will f/up  -H/o preterm delivery- continue Makena every Thursday, patient can have her mother bring it from home   Robley Fries 08/29/2021, 2:51 PM

## 2021-08-29 NOTE — MAU Provider Note (Signed)
History     CSN: [id number]  Arrival date and time: 08/29/21 1143   Event Date/Time   First Provider Initiated Contact with Patient 08/29/21 1225      Chief Complaint  Patient presents with   Abdominal Pain   Back Pain   Contractions   Nausea   Emesis   Diarrhea   Kendra Meyer is a 30 y.o. [id number] at [redacted]w[redacted]d who presents today with left flank pain, nausea/vomiting and diarrhea. She reports that last nigh she started with nausea, then developed vomiting/diarrhea and left sided flank pain. She is also having urinary frequency and dysuria. She is unsure if she is having contractions. She has an history of preterm labor and birth with prior pregnancy. Had last baby at 36 weeks. Denies any complications with this pregnancy. She reports normal fetal movement. She denies any VB or LOF. Patient denies a history of kidney stones.   Back Pain This is a new problem. The current episode started today. The problem occurs constantly. The problem is unchanged. Pain location: left flank. The pain is at a severity of 9/10. Associated symptoms include abdominal pain and dysuria. Pertinent negatives include no fever. Risk factors include pregnancy. She has tried nothing for the symptoms.  Emesis  This is a new problem. The current episode started yesterday. The problem occurs 2 to 4 times per day. The problem has been unchanged. The emesis has an appearance of stomach contents. There has been no fever. Associated symptoms include abdominal pain and diarrhea. Pertinent negatives include no fever.  Diarrhea  This is a new problem. The current episode started yesterday. The problem has been unchanged. Associated symptoms include abdominal pain and vomiting. Pertinent negatives include no fever. Nothing aggravates the symptoms. There are no known risk factors. She has tried nothing for the symptoms.   OB History     Gravida  3   Para  1   Term      Preterm  1   AB  1   Living  1      SAB  1    IAB      Ectopic      Multiple  0   Live Births  1           Past Medical History:  Diagnosis Date   Headache    Hypertension    Medical history non-contributory     Past Surgical History:  Procedure Laterality Date   COLONOSCOPY     NO PAST SURGERIES      Family History  Problem Relation Age of Onset   Cancer Father        colon   Cancer Paternal Grandmother        breast   Cancer Paternal Grandfather        esophageal   Alcohol abuse Brother     Social History   Tobacco Use   Smoking status: Never   Smokeless tobacco: Never  Vaping Use   Vaping Use: Never used  Substance Use Topics   Alcohol use: Never   Drug use: Never    Allergies: No Known Allergies  Medications Prior to Admission  Medication Sig Dispense Refill Last Dose   BABY ASPIRIN PO Take 81 mg by mouth daily.   08/29/2021   hydroxyprogesterone caproate (MAKENA) 250 mg/mL OIL injection Inject 250 mg into the muscle once a week.   Past Week   Prenatal Vit-Fe Fumarate-FA (PRENATAL MULTIVITAMIN) TABS tablet Take 1 tablet by mouth daily at  12 noon.   08/29/2021    Review of Systems  Constitutional:  Negative for fever.  Gastrointestinal:  Positive for abdominal pain, diarrhea, nausea and vomiting.  Genitourinary:  Positive for dysuria, flank pain and frequency.  All other systems reviewed and are negative. Physical Exam   Blood pressure 129/75, pulse 86, temperature 98.2 F (36.8 C), temperature source Oral, resp. rate 15, SpO2 100 %, unknown if currently breastfeeding.  Physical Exam Vitals and nursing note reviewed. Exam conducted with a chaperone present.  Constitutional:      General: She is not in acute distress. HENT:     Head: Normocephalic.  Eyes:     Pupils: Pupils are equal, round, and reactive to light.  Cardiovascular:     Rate and Rhythm: Normal rate.  Pulmonary:     Effort: Pulmonary effort is normal.  Abdominal:     Palpations: Abdomen is soft.     Tenderness:  There is no abdominal tenderness.  Genitourinary:    Comments: + CVA tenderness on the left   Dilation: Closed Effacement (%): Thick Station: Ballotable Exam by:: Ashling Roane,CNM  Skin:    General: Skin is warm and dry.  Neurological:     Mental Status: She is oriented to person, place, and time.  Psychiatric:        Mood and Affect: Mood normal.        Behavior: Behavior normal.    NST:  Baseline: 145 Variability: moderate Accels: 10x10, 15x15 Decels: none Toco: none Reactive/Appropriate for GA    Results for orders placed or performed during the hospital encounter of 08/29/21 (from the past 24 hour(s))  Urinalysis, Routine w reflex microscopic Urine, Clean Catch     Status: Abnormal   Collection Time: 08/29/21 12:08 PM  Result Value Ref Range   Color, Urine YELLOW YELLOW   APPearance CLOUDY (A) CLEAR   Specific Gravity, Urine 1.017 1.005 - 1.030   pH 8.0 5.0 - 8.0   Glucose, UA NEGATIVE NEGATIVE mg/dL   Hgb urine dipstick NEGATIVE NEGATIVE   Bilirubin Urine NEGATIVE NEGATIVE   Ketones, ur 5 (A) NEGATIVE mg/dL   Protein, ur 30 (A) NEGATIVE mg/dL   Nitrite POSITIVE (A) NEGATIVE   Leukocytes,Ua LARGE (A) NEGATIVE   RBC / HPF 6-10 0 - 5 RBC/hpf   WBC, UA >50 (H) 0 - 5 WBC/hpf   Bacteria, UA MANY (A) NONE SEEN   Squamous Epithelial / LPF 11-20 0 - 5   Mucus PRESENT    Sperm, UA PRESENT   Comprehensive metabolic panel     Status: Abnormal   Collection Time: 08/29/21 12:59 PM  Result Value Ref Range   Sodium 136 135 - 145 mmol/L   Potassium 3.6 3.5 - 5.1 mmol/L   Chloride 104 98 - 111 mmol/L   CO2 22 22 - 32 mmol/L   Glucose, Bld 97 70 - 99 mg/dL   BUN 8 6 - 20 mg/dL   Creatinine, Ser 5.03 0.44 - 1.00 mg/dL   Calcium 8.5 (L) 8.9 - 10.3 mg/dL   Total Protein 6.5 6.5 - 8.1 g/dL   Albumin 3.0 (L) 3.5 - 5.0 g/dL   AST 16 15 - 41 U/L   ALT 16 0 - 44 U/L   Alkaline Phosphatase 99 38 - 126 U/L   Total Bilirubin 0.6 0.3 - 1.2 mg/dL   GFR, Estimated >54 >65 mL/min    Anion gap 10 5 - 15  CBC with Differential/Platelet     Status: Abnormal  Collection Time: 08/29/21 12:59 PM  Result Value Ref Range   WBC 25.1 (H) 4.0 - 10.5 K/uL   RBC 3.87 3.87 - 5.11 MIL/uL   Hemoglobin 11.8 (L) 12.0 - 15.0 g/dL   HCT 52.8 (L) 41.3 - 24.4 %   MCV 87.6 80.0 - 100.0 fL   MCH 30.5 26.0 - 34.0 pg   MCHC 34.8 30.0 - 36.0 g/dL   RDW 01.0 27.2 - 53.6 %   Platelets 216 150 - 400 K/uL   nRBC 0.0 0.0 - 0.2 %   Neutrophils Relative % 89 %   Neutro Abs 22.3 (H) 1.7 - 7.7 K/uL   Lymphocytes Relative 8 %   Lymphs Abs 2.0 0.7 - 4.0 K/uL   Monocytes Relative 3 %   Monocytes Absolute 0.8 0.1 - 1.0 K/uL   Eosinophils Relative 0 %   Eosinophils Absolute 0.0 0.0 - 0.5 K/uL   Basophils Relative 0 %   Basophils Absolute 0.0 0.0 - 0.1 K/uL   nRBC 0 0 /100 WBC   Abs Immature Granulocytes 0.00 0.00 - 0.07 K/uL    MAU Course  Procedures  MDM  Patient is tolerating some ice chips after zofran  1356: DW. Dr. Jolayne Panther and she agrees that patient should be admitted for presumed pyelo. Will call Dr. Juliene Pina for admission.   1413: Dr. Juliene Pina on the unit and agrees with plan for admission at this time. She will talk with the patient.   Assessment and Plan  Presumed pyelonephritis  Admit to [facility] for IV antibiotics [place] OBYGN assumes care of the patient.   Thressa Sheller DNP, CNM  08/29/21  2:13 PM

## 2021-08-29 NOTE — MAU Note (Addendum)
.  Kendra Meyer is a 30 y.o. at [redacted]w[redacted]d here in MAU reporting: started having diarrhea this morning at 0630. States around 0800 she started having sharp pain in the left side of her abdomen that wraps around to her back. Had x1 episode of vomiting around 1100. Is now feeling ctx. Denies VB or LOF. Endorses good fetal movement. Had PTL with last baby at 33 weeks. Has urinary frequency.   Pain score: 7 Vitals:   08/29/21 1202  BP: 129/75  Pulse: 86  Resp: 15  Temp: 98.2 F (36.8 C)  SpO2: 100%     FHT:145 Lab orders placed from triage:  UA

## 2021-08-30 LAB — CBC WITH DIFFERENTIAL/PLATELET
Abs Immature Granulocytes: 0 10*3/uL (ref 0.00–0.07)
Basophils Absolute: 0 10*3/uL (ref 0.0–0.1)
Basophils Relative: 0 %
Eosinophils Absolute: 0 10*3/uL (ref 0.0–0.5)
Eosinophils Relative: 0 %
HCT: 27.4 % — ABNORMAL LOW (ref 36.0–46.0)
Hemoglobin: 9.4 g/dL — ABNORMAL LOW (ref 12.0–15.0)
Lymphocytes Relative: 1 %
Lymphs Abs: 0.3 10*3/uL — ABNORMAL LOW (ref 0.7–4.0)
MCH: 30.1 pg (ref 26.0–34.0)
MCHC: 34.3 g/dL (ref 30.0–36.0)
MCV: 87.8 fL (ref 80.0–100.0)
Monocytes Absolute: 0.6 10*3/uL (ref 0.1–1.0)
Monocytes Relative: 2 %
Neutro Abs: 30.8 10*3/uL — ABNORMAL HIGH (ref 1.7–7.7)
Neutrophils Relative %: 97 %
Platelets: 170 10*3/uL (ref 150–400)
RBC: 3.12 MIL/uL — ABNORMAL LOW (ref 3.87–5.11)
RDW: 13.8 % (ref 11.5–15.5)
WBC: 31.8 10*3/uL — ABNORMAL HIGH (ref 4.0–10.5)
nRBC: 0 % (ref 0.0–0.2)
nRBC: 0 /100 WBC

## 2021-08-30 LAB — COMPREHENSIVE METABOLIC PANEL
ALT: 15 U/L (ref 0–44)
AST: 16 U/L (ref 15–41)
Albumin: 2.4 g/dL — ABNORMAL LOW (ref 3.5–5.0)
Alkaline Phosphatase: 77 U/L (ref 38–126)
Anion gap: 10 (ref 5–15)
BUN: 6 mg/dL (ref 6–20)
CO2: 21 mmol/L — ABNORMAL LOW (ref 22–32)
Calcium: 7.7 mg/dL — ABNORMAL LOW (ref 8.9–10.3)
Chloride: 105 mmol/L (ref 98–111)
Creatinine, Ser: 0.81 mg/dL (ref 0.44–1.00)
GFR, Estimated: >60 mL/min (ref >60)
Glucose, Bld: 112 mg/dL — ABNORMAL HIGH (ref 70–99)
Potassium: 3.4 mmol/L — ABNORMAL LOW (ref 3.5–5.1)
Sodium: 136 mmol/L (ref 135–145)
Total Bilirubin: 1 mg/dL (ref 0.3–1.2)
Total Protein: 5.3 g/dL — ABNORMAL LOW (ref 6.5–8.1)

## 2021-08-30 MED ORDER — ACETAMINOPHEN 325 MG PO TABS
650.0000 mg | ORAL_TABLET | ORAL | Status: DC | PRN
  Administered 2021-08-30 – 2021-08-31 (×5): 650 mg via ORAL
  Filled 2021-08-30 (×6): qty 2

## 2021-08-30 MED ORDER — HYDROXYPROGESTERONE CAPROATE 250 MG/ML IM OIL
250.0000 mg | TOPICAL_OIL | Freq: Once | INTRAMUSCULAR | Status: AC
  Administered 2021-08-30: 250 mg via INTRAMUSCULAR
  Filled 2021-08-30: qty 1

## 2021-08-30 MED ORDER — LACTATED RINGERS IV BOLUS: 500.0000 mL | Freq: Once | INTRAVENOUS | Status: DC

## 2021-08-30 NOTE — Progress Notes (Signed)
Urine culture just reported-  E coli , sensitivity is pending.

## 2021-08-30 NOTE — Progress Notes (Signed)
Patient ID: Kendra Meyer, female   DOB: 1991/04/23, 30 y.o.   MRN: [id number] HD #2.  Acute left Pyelonephritis, 30. 2 wks.   S: Feels better this morning after fever/ chills/ worse pain last night. Nausea better as well, is able to eat something O:  BP (!) 115/55 (BP Location: Left Arm)   Pulse (!) 115   Temp 99.4 F (37.4 C) (Oral)   Resp 18   Ht 5\' 4"  (1.626 m)   SpO2 98%   BMI 29.68 kg/m   Patient Vitals for the past 24 hrs:  BP Temp Temp src Pulse Resp SpO2 Height  08/30/21 0752 (!) 115/55 99.4 F (37.4 C) Oral (!) 115 18 98 % --  08/30/21 0349 101/64 98.5 F (36.9 C) Oral (!) 113 18 99 % --  08/30/21 0145 -- 99.3 F (37.4 C) Oral -- -- -- --  08/30/21 0015 -- 99.8 F (37.7 C) Oral -- -- -- --  08/29/21 2034 126/69 98.8 F (37.1 C) Oral (!) 113 -- -- --  08/29/21 1624 -- (!) 97.5 F (36.4 C) Oral -- -- -- 5\' 4"  (1.626 m)  08/29/21 1520 131/72 (!) 97.5 F (36.4 C) Oral 83 18 99 % --  08/29/21 1503 123/79 -- -- 88 17 -- --  08/29/21 1202 129/75 98.2 F (36.8 C) Oral 86 15 100 % --    Exam- NAD A&O x 3 Abdo soft, relaxed gravid uterus  Extr no c/c/e  Back Left CVA milder tenderness   NST 150s mod variab + accel no decels reactive  Toco none   A/P: 30.2 wks 1) acute left pyelonephritis - cont IV Ceftriaxone, UCx pending , CBC worse. Renal sono- mild left hydronephrosis, physiologic  2) elevated WBC in preg, seeing Heme, currently worse w/ infection  3) H/o PTD, contin Makena, q Thursdays - today  Anticipate Dc tomorrow with total 14 days of Abx and then suppression with low dose abx through pregnancy

## 2021-08-30 NOTE — Progress Notes (Signed)
Dr Conni Elliot notified of temp 102.7 F.  No new orders.  Tylenol 650mg  po given.

## 2021-08-31 LAB — CBC WITH DIFFERENTIAL/PLATELET
Abs Immature Granulocytes: 0.22 10*3/uL — ABNORMAL HIGH (ref 0.00–0.07)
Basophils Absolute: 0 10*3/uL (ref 0.0–0.1)
Basophils Relative: 0 %
Eosinophils Absolute: 0.1 10*3/uL (ref 0.0–0.5)
Eosinophils Relative: 0 %
HCT: 25.6 % — ABNORMAL LOW (ref 36.0–46.0)
Hemoglobin: 8.8 g/dL — ABNORMAL LOW (ref 12.0–15.0)
Immature Granulocytes: 1 %
Lymphocytes Relative: 3 %
Lymphs Abs: 0.5 10*3/uL — ABNORMAL LOW (ref 0.7–4.0)
MCH: 30.4 pg (ref 26.0–34.0)
MCHC: 34.4 g/dL (ref 30.0–36.0)
MCV: 88.6 fL (ref 80.0–100.0)
Monocytes Absolute: 0.9 10*3/uL (ref 0.1–1.0)
Monocytes Relative: 5 %
Neutro Abs: 16.1 10*3/uL — ABNORMAL HIGH (ref 1.7–7.7)
Neutrophils Relative %: 91 %
Platelets: 141 10*3/uL — ABNORMAL LOW (ref 150–400)
RBC: 2.89 MIL/uL — ABNORMAL LOW (ref 3.87–5.11)
RDW: 14 % (ref 11.5–15.5)
WBC: 17.7 10*3/uL — ABNORMAL HIGH (ref 4.0–10.5)
nRBC: 0 % (ref 0.0–0.2)

## 2021-08-31 LAB — CULTURE, OB URINE: Culture: >=100000 — AB

## 2021-08-31 MED ORDER — HYDROMORPHONE HCL 2 MG PO TABS
2.0000 mg | ORAL_TABLET | ORAL | Status: DC | PRN
Start: 2021-08-31 — End: 2021-09-01
  Administered 2021-08-31 – 2021-09-01 (×3): 2 mg via ORAL
  Filled 2021-08-31 (×3): qty 1

## 2021-08-31 NOTE — Progress Notes (Signed)
Kendra Meyer 30 y.o. [id number] at [redacted]w[redacted]d HD#3 admitted with back pain in setting of pyelonephritis  Patient resting comfortably this morning. She reports feeling significantly better than when she first came in. Back pain, primarily on the left side, only with ambulating or rolling over in the bed. Pain now manageable and did not request pain medications over night. No N/V. However patient did have fevers and admits to feeling very flushed and hot when fevers happened. None currently. Feeling good mvmt from baby. Denies any CTXs, VB, or LOF. No new concerns. Patient is very anxious to go home as she has her 42.66 year old daughter at home she is missing. Husband came to bedside during visit.  O: Vitals:   08/31/21 0330 08/31/21 0414 08/31/21 0650 08/31/21 0800  BP: (!) 114/47   120/67  Pulse: (!) 129 (!) 110  86  Resp: 18 20  20   Temp: (!) 101.1 F (38.4 C) 99.9 F (37.7 C) 98.3 F (36.8 C) 98.4 F (36.9 C)  TempSrc: Oral Oral Oral Oral  SpO2: 96%   97%  Weight:      Height:       CBC Latest Ref Rng & Units 08/31/2021 08/30/2021 08/29/2021  WBC 4.0 - 10.5 K/uL 17.7(H) 31.8(H) 25.1(H)  Hemoglobin 12.0 - 15.0 g/dL 10/29/2021) 7.2(C) 11.8(L)  Hematocrit 36.0 - 46.0 % 25.6(L) 27.4(L) 33.9(L)  Platelets 150 - 400 K/uL 141(L) 170 216   Physical Exam: -General: AAO, NAD -Heart: RRR -Lungs: CTABL no rales, no wheezes, no crackles -Abdomen: gravid uterus, no fundal tenderness, no suprapubic tenderness -Back: neg CVA tenderness b/l -Extremities: no LE edema  Fetal Monitoring:  -NST: reactive baseline 155 bpm mod var +accels 15x15s, no decels -Toco: 1 ctx noted on 20 min tracing  Kendra/P: Kendra Meyer 30 y.o. Kendra Meyer at [redacted]w[redacted]d HD#3 admitted with pyelonephritis, now with fevers overnight but overall clinically improving  Pyelonephritis -S/p 2 doses of IV Rocephin. Tmax of 102.7 last night at 2000, responded to PO Tylenol and spiked again at 0330 this morning 101.1 responded to Tylenol again  and has been afebrile since then. Ucx results reviewed: E.coli and pan-sensitive, will continue IV Rocephin and monitor fever curve. WBC improved from 31 yesterday to 17.7 this morning. Will put in repeat labs for AM -Continue IVF hydration LR 125 -Flomax daily -Tylenol PRN fever/pain -Patient advised rec to be afebrile for at least 24hr prior to transition to PO antibiotic regimen for DC home. Plan for 14 day total antibiotic treatment course and daily suppression to follow throughout remainder of pregnancy Hx PTD -Weekly Makena received yesterday (Thursday admins) Hx PEC -Daily baby ASA -Normal BPs Antepartum Care -Continue NST q shift- reactive this morning -Continue daily PNV -Bowel regimen PRN -Ambien PRN sleep  Remain inpatient antepartum care   Kendra Meyer Kendra Meyer 08/31/21 10:50 AM

## 2021-09-01 MED ORDER — ACETAMINOPHEN 325 MG PO TABS: 650.0000 mg | ORAL_TABLET | ORAL | Status: DC | PRN

## 2021-09-01 MED ORDER — CEPHALEXIN 500 MG PO CAPS: 500.0000 mg | ORAL_CAPSULE | Freq: Four times a day (QID) | ORAL | 0 refills | Status: AC

## 2021-09-01 MED ORDER — DIPHENHYDRAMINE HCL 25 MG PO CAPS
25.0000 mg | ORAL_CAPSULE | Freq: Once | ORAL | Status: AC
  Administered 2021-09-01: 25 mg via ORAL
  Filled 2021-09-01: qty 1

## 2021-09-01 MED ORDER — CEPHALEXIN 500 MG PO CAPS: 500.0000 mg | ORAL_CAPSULE | Freq: Every day | ORAL | 1 refills | Status: DC

## 2021-09-01 MED ORDER — SODIUM CHLORIDE 0.9 % IV SOLN
500.0000 mg | Freq: Once | INTRAVENOUS | Status: AC
  Administered 2021-09-01: 500 mg via INTRAVENOUS
  Filled 2021-09-01: qty 25

## 2021-09-01 NOTE — Plan of Care (Signed)
Discharge instructions given, meds reviewed and patient verbalizes understanding.

## 2021-09-01 NOTE — Discharge Summary (Addendum)
Physician Discharge Summary  Patient ID: Kendra Meyer MRN: [id number] DOB/AGE: 1991-01-16 30 y.o.  Admit date: 08/29/2021 Discharge date: 09/01/2021  Admission Diagnoses:Acute pyelonephritis. 30.[redacted]weeks gestation  Discharge Diagnoses: Acute pyelonephritis, E coli in culture, pan-sensitive 30.4 weeks                                         Anemia from kidney infection   Discharged Condition: good  Hospital Course: Patient was admitted for acute pyelonephritis - left kidney.  Renal sono noted mild physiologic hydroureter, no obvious stone noted.  Daily IV Ceftriaxone and Flomax PO daily for radiating pain, IV Dilaudid every 4-6 hours for acute pain for 48 hours. She was 24 hours afebrile as on 3 am on 09/01/21, so was stable for discharge.  Scheduled Makena on Thursday 08/30/21 IV Venofer 500mg  on 09/01/21 for acute anemia from kidney infection   NST daily was reactive. Fetal tachycardia noted w/ maternal fever but resolved as maternal fever resolved. She remained contractile   Significant Diagnostic Studies: Renal ultrasound   Discharge Exam: Blood pressure (!) 122/58, pulse 93, temperature 98.3 F (36.8 C), temperature source Oral, resp. rate 17, height 5' 4.5" (1.638 m), weight 78.4 kg, SpO2 96 %, unknown if currently breastfeeding. CBC Latest Ref Rng & Units 08/31/2021 08/30/2021 08/29/2021  WBC 4.0 - 10.5 K/uL 17.7(H) 31.8(H) 25.1(H)  Hemoglobin 12.0 - 15.0 g/dL 10/29/2021) 1.5(Q) 11.8(L)  Hematocrit 36.0 - 46.0 % 25.6(L) 27.4(L) 33.9(L)  Platelets 150 - 400 K/uL 141(L) 170 216   Renal sono  CLINICAL DATA:  Left flank pain, [redacted] weeks pregnant EXAM: RENAL / URINARY TRACT ULTRASOUND COMPLETE COMPARISON:  None. FINDINGS: Right Kidney: Renal measurements: 11.7 x 5.4 x 5.5 cm = volume: 179 mL. Echogenicity within normal limits. No mass or hydronephrosis visualized.   Left Kidney: Renal measurements: 12.3 x 5.7 x 5.6 cm = volume: 205 mL. There is normal renal cortical  echogenicity and preserved cortical thickness. There is mild left hydronephrosis present. No intrarenal masses or calculi are identified.   Bladder: The bladder is largely decompressed. Other:None.   IMPRESSION: Mild left hydronephrosis.  Preserved cortical thickness.  Electronically Signed   By: 0.0(Q M.D.   On: 08/29/2021 19:35   Physical Exam: -General: AAO, NAD -Heart: RRR -Lungs: CTABL no rales, no wheezes, no crackles -Abdomen: gravid uterus, no fundal tenderness, no suprapubic tenderness -Back: neg CVA tenderness b/l -Extremities: no LE edema    Disposition: Discharge disposition: 01-Home or Self Care      Discharge Instructions     Call MD for:   Complete by: As directed    Painful uterine contraction, loss of fluid per vagina, vaginal bleeding, decreased baby movements   Call MD for:  extreme fatigue   Complete by: As directed    Call MD for:  persistant dizziness or light-headedness   Complete by: As directed    Call MD for:  persistant nausea and vomiting   Complete by: As directed    Call MD for:  severe uncontrolled pain   Complete by: As directed    Call MD for:  temperature >100.4   Complete by: As directed    Diet - low sodium heart healthy   Complete by: As directed    Increase activity slowly   Complete by: As directed       Allergies as of 09/01/2021   No Known Allergies  Medication List     TAKE these medications    acetaminophen 325 MG tablet Commonly known as: TYLENOL Take 2 tablets (650 mg total) by mouth every 4 (four) hours as needed for mild pain or fever.   BABY ASPIRIN PO Take 81 mg by mouth daily.   cephALEXin 500 MG capsule Commonly known as: Keflex Take 1 capsule (500 mg total) by mouth 4 (four) times daily for 11 days.   cephALEXin 500 MG capsule Commonly known as: Keflex Take 1 capsule (500 mg total) by mouth daily at 2 PM.   hydroxyprogesterone caproate 250 mg/mL Oil injection Commonly known  as: MAKENA Inject 250 mg into the muscle once a week.   prenatal multivitamin Tabs tablet Take 1 tablet by mouth daily at 12 noon.         SignedRobley Fries 09/01/2021, 11:29 AM

## 2021-09-01 NOTE — Plan of Care (Signed)
  Problem: Education: Goal: Knowledge of disease or condition will improve Outcome: Completed/Met   Problem: Education: Goal: Knowledge of General Education information will improve Description: Including pain rating scale, medication(s)/side effects and non-pharmacologic comfort measures Outcome: Completed/Met   Problem: Nutrition: Goal: Adequate nutrition will be maintained Outcome: Completed/Met   Problem: Coping: Goal: Level of anxiety will decrease Outcome: Completed/Met

## 2021-09-01 NOTE — Progress Notes (Signed)
Kendra Meyer 30 y.o. [id number] at 30w 4d HD# 4 admitted for acute pyelonephritis  S: Back pain with movements, not radiating to groin. No f/c/sweats/. Ready to go home. No UCs/ LOF/ VB   O: Vitals:   08/31/21 2129 08/31/21 2353 09/01/21 0346 09/01/21 0700  BP:  (!) 113/58 122/67 (!) 122/58  Pulse:  98 (!) 109 93  Resp:  18 18 17   Temp: 99.6 F (37.6 C) 98.6 F (37 C) 98.4 F (36.9 C) 98.3 F (36.8 C)  TempSrc: Oral Oral Oral Oral  SpO2:  98% 98% 96%  Weight:      Height:       CBC Latest Ref Rng & Units 08/31/2021 08/30/2021 08/29/2021  WBC 4.0 - 10.5 K/uL 17.7(H) 31.8(H) 25.1(H)  Hemoglobin 12.0 - 15.0 g/dL 10/29/2021) 4.2(A) 11.8(L)  Hematocrit 36.0 - 46.0 % 25.6(L) 27.4(L) 33.9(L)  Platelets 150 - 400 K/uL 141(L) 170 216   Physical Exam: -General: AAO, NAD -Heart: RRR -Lungs: CTABL no rales, no wheezes, no crackles -Abdomen: gravid uterus, no fundal tenderness, no suprapubic tenderness -Back: neg CVA tenderness b/l -Extremities: no LE edema  Fetal Monitoring:  -NST: reactive baseline 155 bpm mod var +accels 15x15s, no decels -Toco: none  A/P: [name] 30 y.o. [name] at [redacted]w[redacted]d HD#4 admitted with pyelonephritis, improved.   Pyelonephritis -S/p 3 doses of IV Rocephin. E.coli and pan-sensitive, stop Flomax. Dc home w/ PO Keflex 500mg  QID x 11 days and then 1 daily for suppression until delivery  -Tylenol PRN fever/pain Hx PTD -Weekly Makena (Thursday admins) Hx PEC -Daily baby ASA, Normal BPs Anemia due to pyelonephritis - IV Venofer 500 mg before discharge  Has Ob appointment w/ Dr in office on 10/17. PTL s/s, FAC d/w pt , warning s/s and call back s/s d/w pt    [name] 09/01/21 10:39 AM

## 2021-09-13 ENCOUNTER — Telehealth: Payer: Self-pay | Admitting: Hematology and Oncology

## 2021-09-13 NOTE — Telephone Encounter (Signed)
R/s appt per provider request to change to a different provider. Called an left msg of new date and time

## 2021-09-19 ENCOUNTER — Other Ambulatory Visit: Payer: Managed Care, Other (non HMO)

## 2021-09-19 ENCOUNTER — Ambulatory Visit: Payer: Managed Care, Other (non HMO) | Admitting: Physician Assistant

## 2021-09-26 ENCOUNTER — Other Ambulatory Visit: Payer: Managed Care, Other (non HMO)

## 2021-09-26 ENCOUNTER — Ambulatory Visit: Payer: Managed Care, Other (non HMO) | Admitting: Physician Assistant

## 2021-10-09 LAB — OB RESULTS CONSOLE GBS: GBS: NEGATIVE

## 2021-10-12 ENCOUNTER — Other Ambulatory Visit: Payer: Self-pay | Admitting: Obstetrics and Gynecology

## 2021-10-16 ENCOUNTER — Inpatient Hospital Stay (HOSPITAL_COMMUNITY): Payer: Managed Care, Other (non HMO) | Admitting: Anesthesiology

## 2021-10-16 ENCOUNTER — Inpatient Hospital Stay (HOSPITAL_COMMUNITY)
Admission: AD | Admit: 2021-10-16 | Discharge: 2021-10-17 | DRG: 807 | Disposition: A | Discharge status: Home / Self Care | Payer: Managed Care, Other (non HMO) | Attending: Obstetrics and Gynecology | Admitting: Obstetrics and Gynecology

## 2021-10-16 ENCOUNTER — Encounter (HOSPITAL_COMMUNITY): Payer: Self-pay | Admitting: Obstetrics and Gynecology

## 2021-10-16 ENCOUNTER — Other Ambulatory Visit: Payer: Self-pay

## 2021-10-16 ENCOUNTER — Inpatient Hospital Stay (HOSPITAL_COMMUNITY): Payer: Managed Care, Other (non HMO) | Attending: Obstetrics and Gynecology

## 2021-10-16 DIAGNOSIS — Z8616 Personal history of COVID-19: Secondary | ICD-10-CM

## 2021-10-16 DIAGNOSIS — Z20822 Contact with and (suspected) exposure to covid-19: Secondary | ICD-10-CM | POA: Diagnosis present

## 2021-10-16 DIAGNOSIS — O134 Gestational [pregnancy-induced] hypertension without significant proteinuria, complicating childbirth: Principal | ICD-10-CM | POA: Diagnosis present

## 2021-10-16 DIAGNOSIS — Z3A37 37 weeks gestation of pregnancy: Secondary | ICD-10-CM | POA: Diagnosis not present

## 2021-10-16 DIAGNOSIS — Z3490 Encounter for supervision of normal pregnancy, unspecified, unspecified trimester: Secondary | ICD-10-CM | POA: Diagnosis present

## 2021-10-16 DIAGNOSIS — O139 Gestational [pregnancy-induced] hypertension without significant proteinuria, unspecified trimester: Secondary | ICD-10-CM | POA: Diagnosis present

## 2021-10-16 LAB — CBC
HCT: 33.1 % — ABNORMAL LOW (ref 36.0–46.0)
HCT: 34.7 % — ABNORMAL LOW (ref 36.0–46.0)
Hemoglobin: 11.6 g/dL — ABNORMAL LOW (ref 12.0–15.0)
Hemoglobin: 11.6 g/dL — ABNORMAL LOW (ref 12.0–15.0)
MCH: 30.8 pg (ref 26.0–34.0)
MCH: 29.7 pg (ref 26.0–34.0)
MCHC: 35 g/dL (ref 30.0–36.0)
MCHC: 33.4 g/dL (ref 30.0–36.0)
MCV: 87.8 fL (ref 80.0–100.0)
MCV: 89 fL (ref 80.0–100.0)
Platelets: 234 10*3/uL (ref 150–400)
Platelets: 240 10*3/uL (ref 150–400)
RBC: 3.77 MIL/uL — ABNORMAL LOW (ref 3.87–5.11)
RBC: 3.9 MIL/uL (ref 3.87–5.11)
RDW: 14.1 % (ref 11.5–15.5)
RDW: 14.1 % (ref 11.5–15.5)
WBC: 16.9 10*3/uL — ABNORMAL HIGH (ref 4.0–10.5)
WBC: 21.5 10*3/uL — ABNORMAL HIGH (ref 4.0–10.5)
nRBC: 0 % (ref 0.0–0.2)
nRBC: 0 % (ref 0.0–0.2)

## 2021-10-16 LAB — RESP PANEL BY RT-PCR (FLU A&B, COVID) ARPGX2
Influenza A by PCR: NEGATIVE
Influenza B by PCR: NEGATIVE
SARS Coronavirus 2 by RT PCR: NEGATIVE

## 2021-10-16 LAB — RPR: RPR Ser Ql: NONREACTIVE

## 2021-10-16 LAB — COMPREHENSIVE METABOLIC PANEL
ALT: 16 U/L (ref 0–44)
AST: 17 U/L (ref 15–41)
Albumin: 2.7 g/dL — ABNORMAL LOW (ref 3.5–5.0)
Alkaline Phosphatase: 90 U/L (ref 38–126)
Anion gap: 8 (ref 5–15)
BUN: 7 mg/dL (ref 6–20)
CO2: 20 mmol/L — ABNORMAL LOW (ref 22–32)
Calcium: 8.2 mg/dL — ABNORMAL LOW (ref 8.9–10.3)
Chloride: 106 mmol/L (ref 98–111)
Creatinine, Ser: 0.34 mg/dL — ABNORMAL LOW (ref 0.44–1.00)
GFR, Estimated: >60 mL/min (ref >60)
Glucose, Bld: 92 mg/dL (ref 70–99)
Potassium: 3.7 mmol/L (ref 3.5–5.1)
Sodium: 134 mmol/L — ABNORMAL LOW (ref 135–145)
Total Bilirubin: 0.3 mg/dL (ref 0.3–1.2)
Total Protein: 6 g/dL — ABNORMAL LOW (ref 6.5–8.1)

## 2021-10-16 LAB — TYPE AND SCREEN
ABO/RH(D): A POS
Antibody Screen: NEGATIVE

## 2021-10-16 LAB — URIC ACID: Uric Acid, Serum: 4.1 mg/dL (ref 2.5–7.1)

## 2021-10-16 LAB — PROTEIN / CREATININE RATIO, URINE
Creatinine, Urine: 66.78 mg/dL
Protein Creatinine Ratio: 0.24 mg/mg{Cre} — ABNORMAL HIGH (ref 0.00–0.15)
Total Protein, Urine: 16 mg/dL

## 2021-10-16 MED ORDER — LACTATED RINGERS IV SOLN
500.0000 mL | INTRAVENOUS | Status: DC | PRN
Start: 2021-10-16 — End: 2021-10-16

## 2021-10-16 MED ORDER — SENNOSIDES-DOCUSATE SODIUM 8.6-50 MG PO TABS
2.0000 | ORAL_TABLET | Freq: Every day | ORAL | Status: DC
  Administered 2021-10-17: 2 via ORAL
  Filled 2021-10-16: qty 2

## 2021-10-16 MED ORDER — COCONUT OIL OIL: 1.0000 "application " | TOPICAL_OIL | Status: DC | PRN

## 2021-10-16 MED ORDER — OXYTOCIN BOLUS FROM INFUSION
333.0000 mL | Freq: Once | INTRAVENOUS | Status: AC
  Administered 2021-10-16: 333 mL via INTRAVENOUS

## 2021-10-16 MED ORDER — ONDANSETRON HCL 4 MG/2ML IJ SOLN: 4.0000 mg | INTRAMUSCULAR | Status: DC | PRN

## 2021-10-16 MED ORDER — ZOLPIDEM TARTRATE 5 MG PO TABS: 5.0000 mg | ORAL_TABLET | Freq: Every evening | ORAL | Status: DC | PRN

## 2021-10-16 MED ORDER — MISOPROSTOL 25 MCG QUARTER TABLET: 25.0000 ug | ORAL_TABLET | ORAL | Status: DC | PRN

## 2021-10-16 MED ORDER — TERBUTALINE SULFATE 1 MG/ML IJ SOLN: 0.2500 mg | Freq: Once | INTRAMUSCULAR | Status: DC | PRN

## 2021-10-16 MED ORDER — BENZOCAINE-MENTHOL 20-0.5 % EX AERO
1.0000 "application " | INHALATION_SPRAY | CUTANEOUS | Status: DC | PRN
  Administered 2021-10-16: 1 via TOPICAL

## 2021-10-16 MED ORDER — TETANUS-DIPHTH-ACELL PERTUSSIS 5-2.5-18.5 LF-MCG/0.5 IM SUSY: 0.5000 mL | PREFILLED_SYRINGE | Freq: Once | INTRAMUSCULAR | Status: DC

## 2021-10-16 MED ORDER — LACTATED RINGERS IV SOLN
500.0000 mL | Freq: Once | INTRAVENOUS | Status: DC
  Administered 2021-10-16: 500 mL via INTRAVENOUS

## 2021-10-16 MED ORDER — DIBUCAINE (PERIANAL) 1 % EX OINT: 1.0000 "application " | TOPICAL_OINTMENT | CUTANEOUS | Status: DC | PRN

## 2021-10-16 MED ORDER — ONDANSETRON HCL 4 MG PO TABS: 4.0000 mg | ORAL_TABLET | ORAL | Status: DC | PRN

## 2021-10-16 MED ORDER — PHENYLEPHRINE 40 MCG/ML (10ML) SYRINGE FOR IV PUSH (FOR BLOOD PRESSURE SUPPORT): 80.0000 ug | PREFILLED_SYRINGE | INTRAVENOUS | Status: DC | PRN

## 2021-10-16 MED ORDER — LACTATED RINGERS IV SOLN: INTRAVENOUS | Status: DC

## 2021-10-16 MED ORDER — LIDOCAINE HCL (PF) 1 % IJ SOLN
INTRAMUSCULAR | Status: DC | PRN
  Administered 2021-10-16 (×2): 5 mL via EPIDURAL

## 2021-10-16 MED ORDER — EPHEDRINE 5 MG/ML INJ: 10.0000 mg | INTRAVENOUS | Status: DC | PRN

## 2021-10-16 MED ORDER — ACETAMINOPHEN 325 MG PO TABS: 650.0000 mg | ORAL_TABLET | ORAL | Status: DC | PRN

## 2021-10-16 MED ORDER — WITCH HAZEL-GLYCERIN EX PADS: 1.0000 "application " | MEDICATED_PAD | CUTANEOUS | Status: DC | PRN

## 2021-10-16 MED ORDER — ONDANSETRON HCL 4 MG/2ML IJ SOLN: 4.0000 mg | Freq: Four times a day (QID) | INTRAMUSCULAR | Status: DC | PRN

## 2021-10-16 MED ORDER — LACTATED RINGERS IV SOLN
500.0000 mL | Freq: Once | INTRAVENOUS | Status: AC
  Administered 2021-10-16: 500 mL via INTRAVENOUS

## 2021-10-16 MED ORDER — DIPHENHYDRAMINE HCL 50 MG/ML IJ SOLN: 12.5000 mg | INTRAMUSCULAR | Status: DC | PRN

## 2021-10-16 MED ORDER — SOD CITRATE-CITRIC ACID 500-334 MG/5ML PO SOLN: 30.0000 mL | ORAL | Status: DC | PRN

## 2021-10-16 MED ORDER — OXYTOCIN-SODIUM CHLORIDE 30-0.9 UT/500ML-% IV SOLN
1.0000 m[IU]/min | INTRAVENOUS | Status: DC
  Administered 2021-10-16: 2 m[IU]/min via INTRAVENOUS

## 2021-10-16 MED ORDER — SIMETHICONE 80 MG PO CHEW: 80.0000 mg | CHEWABLE_TABLET | ORAL | Status: DC | PRN

## 2021-10-16 MED ORDER — OXYTOCIN-SODIUM CHLORIDE 30-0.9 UT/500ML-% IV SOLN: 2.5000 [IU]/h | INTRAVENOUS | Status: DC

## 2021-10-16 MED ORDER — FENTANYL-BUPIVACAINE-NACL 0.5-0.125-0.9 MG/250ML-% EP SOLN
12.0000 mL/h | EPIDURAL | Status: DC | PRN
  Administered 2021-10-16: 12 mL/h via EPIDURAL

## 2021-10-16 MED ORDER — LIDOCAINE HCL (PF) 1 % IJ SOLN: 30.0000 mL | INTRAMUSCULAR | Status: DC | PRN

## 2021-10-16 MED ORDER — IBUPROFEN 600 MG PO TABS
600.0000 mg | ORAL_TABLET | Freq: Four times a day (QID) | ORAL | Status: DC
  Administered 2021-10-16 – 2021-10-17 (×3): 600 mg via ORAL
  Filled 2021-10-16: qty 1

## 2021-10-16 MED ORDER — PRENATAL MULTIVITAMIN CH
1.0000 | ORAL_TABLET | Freq: Every day | ORAL | Status: DC
  Administered 2021-10-17: 1 via ORAL
  Filled 2021-10-16: qty 1

## 2021-10-16 MED ORDER — DIPHENHYDRAMINE HCL 25 MG PO CAPS: 25.0000 mg | ORAL_CAPSULE | Freq: Four times a day (QID) | ORAL | Status: DC | PRN

## 2021-10-16 NOTE — Lactation Note (Signed)
This note was copied from a baby's chart. Lactation Consultation Note  Patient Name: Girl Nate Perri [id number] Date: 10/16/2021   Age:28 hours  When LC entered the room, lights were out and mom was in bed resting. Mom raised up from bed and LC offered to come back later.   LC asked if in the morning would be okay for a return visit and mom agreed.  Mom denied any needs at this time.  Maternal Data    Feeding    LATCH Score                    Lactation Tools Discussed/Used    Interventions    Discharge    Consult Status      Maryruth Hancock [facility] 10/16/2021, 11:22 PM

## 2021-10-16 NOTE — Lactation Note (Signed)
This note was copied from a baby's chart. Lactation Consultation Note  Patient Name: Kendra Meyer INOMV'E Date: 10/16/2021 Reason for consult: L&D Initial assessment;Early term 37-38.6wks Age:28 hours   L&D Initial Consult:  Visited with family < 1 hour after birth Baby STS and awake on mother's chest.  Assisted to latch and observed baby feeding for 5 minutes.  Lab tech entered for blood collection and baby latched to the alternate breast after lab work was collected.  Observed her feeding for an additional 5 minutes.  She was still latched and actively sucking when I left the room.  Reassured parents that lactation assistance will be available on the M/B unit.  Allowed time for family bonding.   Maternal Data    Feeding Mother's Current Feeding Choice: Breast Milk  LATCH Score Latch: Repeated attempts needed to sustain latch, nipple held in mouth throughout feeding, stimulation needed to elicit sucking reflex.  Audible Swallowing: None  Type of Nipple: Everted at rest and after stimulation (short shafted)  Comfort (Breast/Nipple): Soft / non-tender  Hold (Positioning): Assistance needed to correctly position infant at breast and maintain latch.  LATCH Score: 6   Lactation Tools Discussed/Used    Interventions Interventions: Breast feeding basics reviewed;Assisted with latch;Skin to skin  Discharge    Consult Status Consult Status: Follow-up from L&D    Irene Pap Alyxandria Wentz 10/16/2021, 4:44 PM

## 2021-10-16 NOTE — Anesthesia Preprocedure Evaluation (Signed)
Anesthesia Evaluation  Patient identified by MRN, date of birth, ID band Patient awake    Reviewed: Allergy & Precautions, H&P , NPO status , Patient's Chart, lab work & pertinent test results  Airway Mallampati: II   Neck ROM: full    Dental   Pulmonary neg pulmonary ROS,    breath sounds clear to auscultation       Cardiovascular hypertension,  Rhythm:regular Rate:Normal     Neuro/Psych  Headaches,    GI/Hepatic   Endo/Other    Renal/GU      Musculoskeletal   Abdominal   Peds  Hematology   Anesthesia Other Findings   Reproductive/Obstetrics (+) Pregnancy                             Anesthesia Physical Anesthesia Plan  ASA: 2  Anesthesia Plan: Epidural   Post-op Pain Management:    Induction: Intravenous  PONV Risk Score and Plan: 2 and Treatment may vary due to age or medical condition  Airway Management Planned: Natural Airway  Additional Equipment:   Intra-op Plan:   Post-operative Plan:   Informed Consent: I have reviewed the patients History and Physical, chart, labs and discussed the procedure including the risks, benefits and alternatives for the proposed anesthesia with the patient or authorized representative who has indicated his/her understanding and acceptance.     Dental advisory given  Plan Discussed with: Anesthesiologist  Anesthesia Plan Comments:         Anesthesia Quick Evaluation

## 2021-10-16 NOTE — Anesthesia Procedure Notes (Signed)
Epidural Patient location during procedure: OB Start time: 10/16/2021 12:47 PM End time: 10/16/2021 12:58 PM  Staffing Anesthesiologist: Achille Rich, MD Performed: anesthesiologist   Preanesthetic Checklist Completed: patient identified, IV checked, site marked, risks and benefits discussed, monitors and equipment checked, pre-op evaluation and timeout performed  Epidural Patient position: sitting Prep: DuraPrep Patient monitoring: heart rate, cardiac monitor, continuous pulse ox and blood pressure Approach: midline Location: L2-L3 Injection technique: LOR air  Needle:  Needle type: Tuohy  Needle gauge: 17 G Needle length: 9 cm Needle insertion depth: 5 cm Catheter type: closed end flexible Catheter size: 19 Gauge Catheter at skin depth: 12 cm Test dose: negative and Other  Assessment Events: blood not aspirated, injection not painful, no injection resistance and negative IV test  Additional Notes Informed consent obtained prior to proceeding including risk of failure, 1% risk of PDPH, risk of minor discomfort and bruising.  Discussed rare but serious complications including epidural abscess, permanent nerve injury, epidural hematoma.  Discussed alternatives to epidural analgesia and patient desires to proceed.  Timeout performed pre-procedure verifying patient name, procedure, and platelet count.  Patient tolerated procedure well. Reason for block:procedure for pain

## 2021-10-16 NOTE — H&P (Signed)
Kendra Meyer is a 30 y.o. [id number] at [redacted]w[redacted]d gestation presents for IOL for GHTN.  She denies any h/a, vision changes, ruq pain.  Denies lof/vb, occ ctx.  +FM  Antepartum course: gestational hypertension - newly diagnosed, on labetalol 100mg  bid, h/o severe pre-e, AGA; pyelonephritis in pregnancy - s/p tx and then suppression daily; covid in pregnancy; h/o ptd-s/p P[facility]are at [facility] since 10 wks.  See complete pre-natal records  History OB History     Gravida  3   Para  1   Term      Preterm  1   AB  1   Living  1      SAB  1   IAB      Ectopic      Multiple  0   Live Births  1          Past Medical History:  Diagnosis Date   Headache    Hypertension    Medical history non-contributory    Past Surgical History:  Procedure Laterality Date   COLONOSCOPY     NO PAST SURGERIES     Family History: family history includes Alcohol abuse in her brother; Cancer in her father, paternal grandfather, and paternal grandmother. Social History:  reports that she has never smoked. She has never used smokeless tobacco. She reports that she does not drink alcohol and does not use drugs.  ROS: See above otherwise negative  Prenatal labs:  ABO, Rh: --/--/A POS (11/29 0818) Antibody: NEG (11/29 0818) Rubella: Immune (05/27 0000) RPR: Nonreactive (05/27 0000)  HBsAg: Negative (05/27 0000)  HIV:Non-reactive (05/27 0000)  GBS: Negative/-- (11/22 0000)  1 hr Glucola: Normal Genetic screening: Normal Anatomy 05-27-2003:  isolate EIF  Physical Exam:   Dilation: 3 Effacement (%): 60 Station: -3 Exam by:: [email or web address] Blood pressure 135/81, pulse 92, temperature 97.7 F (36.5 C), temperature source Oral, resp. rate 18, height 5\' 4"  (1.626 m), weight 81.2 kg, unknown if currently breastfeeding. A&O x 3 HEENT: Normal Lungs: CTAB CV: RRR Abdominal: Soft, Non-tender, Gravid, and Estimated fetal weight: 6 lbs  Lower Extremities: Non-edematous,  Non-tender  Pelvic Exam:      Dilatation: 4cm     Effacement: 60%     Station: -3     Presentation: Cephalic; arom with copious amount of clear fluid  Labs:  CBC:  Lab Results  Component Value Date   WBC 16.9 (H) 10/16/2021   RBC 3.77 (L) 10/16/2021   HGB 11.6 (L) 10/16/2021   HCT 33.1 (L) 10/16/2021   MCV 87.8 10/16/2021   MCH 30.8 10/16/2021   MCHC 35.0 10/16/2021   RDW 14.1 10/16/2021   PLT 234 10/16/2021   CMP:  Lab Results  Component Value Date   NA 134 (L) 10/16/2021   K 3.7 10/16/2021   CL 106 10/16/2021   CO2 20 (L) 10/16/2021   GLUCOSE 92 10/16/2021   BUN 7 10/16/2021   CREATININE 0.34 (L) 10/16/2021   CALCIUM 8.2 (L) 10/16/2021   PROT 6.0 (L) 10/16/2021   AST 17 10/16/2021   ALT 16 10/16/2021   ALBUMIN 2.7 (L) 10/16/2021   ALKPHOS 90 10/16/2021   BILITOT 0.3 10/16/2021   GFRNONAA >60 10/16/2021   GFRAA >60 04/23/2019   ANIONGAP 8 10/16/2021   Pcratio: pending  Prenatal Transfer Tool  Maternal Diabetes: No Genetic Screening: Normal Maternal Ultrasounds/Referrals: Isolated EIF (echogenic intracardiac focus) Fetal Ultrasounds or other Referrals:  None Maternal Substance Abuse:  No Significant Maternal Medications:  Meds include: Other: makena, keflex, baby asa Significant Maternal Lab Results: Group B Strep negative  FHT: 150s, nml variability, +accels, no decels TOCO: irregular   Assessment/Plan:  31 y.o. [id number] at [redacted]w[redacted]d gestation   GHTN - nml lfts, renal fcn, pc ratio pending; follow bps closely now and pp; h/o severe pre-eclampsia with prior pregnancy; asymptomatic now; pt did not take labetalol this am, will monitor and resume if bps increase, nml currently; begin IOL - plan pitocin, arom; plan svd; plans epidural H/o ptd- s/p makena weekly Isolated EIF; nml nipt H/o pyelo this pregnancy - s/p treatment and daily suppression; can d/c with delivery H/o covid in pregnancy GBS neg RH pos RI   Vick Frees 10/16/2021, 9:20 AM

## 2021-10-17 LAB — CBC
HCT: 33.2 % — ABNORMAL LOW (ref 36.0–46.0)
Hemoglobin: 11.3 g/dL — ABNORMAL LOW (ref 12.0–15.0)
MCH: 30.3 pg (ref 26.0–34.0)
MCHC: 34 g/dL (ref 30.0–36.0)
MCV: 89 fL (ref 80.0–100.0)
Platelets: 208 10*3/uL (ref 150–400)
RBC: 3.73 MIL/uL — ABNORMAL LOW (ref 3.87–5.11)
RDW: 13.9 % (ref 11.5–15.5)
WBC: 19.5 10*3/uL — ABNORMAL HIGH (ref 4.0–10.5)
nRBC: 0 % (ref 0.0–0.2)

## 2021-10-17 MED ORDER — IBUPROFEN 600 MG PO TABS: 600.0000 mg | ORAL_TABLET | Freq: Four times a day (QID) | ORAL | 0 refills | Status: DC

## 2021-10-17 NOTE — Lactation Note (Signed)
This note was copied from a baby's chart. Lactation Consultation Note  Patient Name: Kendra Meyer [id number] Date: 10/17/2021 Reason for consult: Initial assessment Age:30 years P2, Mother reports that she breastfed her first child for 9 months. She reports that's she stopped due to infant biting her . She reports  that she has a history of Mastitis with that first child.   Mother reports that this child is breastfeeding well. She reports that feeding do not hurt at all.  Mother has infant latched when I arrived to the room. Infant latched only 5 mins and released the breast. Mother reports that she is hearing infant swallow frequently. Father reports that infant is stooling frequently.  Advised mother to page [facility] when infant is latched next. Infant has had 5, 10-30 mins feeding in 17 hours.  Discussed cluster feeding.  Discussed S/S of Mastitis and advised mother to be aware of those and keep her breast drained well. Mother to follow up with [facility] as needed.   Maternal Data Has patient been taught Hand Expression?: Yes Does the patient have breastfeeding experience prior to this delivery?: Yes How long did the patient breastfeed?: 9 months  Feeding Mother's Current Feeding Choice: Breast Milk  LATCH Score                    Lactation Tools Discussed/Used    Interventions Interventions: Skin to skin;Hand express;Adjust position;Support pillows;Position options;LC Services brochure;Education  Discharge Pump: Personal (Medela pump, hands free Elvi)  Consult Status Consult Status: Follow-up Date: 10/17/21 Follow-up type: In-patient    Stevan Born [facility] 10/17/2021, 9:39 AM

## 2021-10-17 NOTE — Discharge Summary (Signed)
OB Discharge Summary  Patient Name: Kendra Meyer DOB: 01-Apr-1991 MRN: [id number]  Date of admission: 10/16/2021 Delivering provider: Dorisann Frames K   Admitting diagnosis: Encounter for induction of labor [Z34.90] Intrauterine pregnancy: [redacted]w[redacted]d     Secondary diagnosis: Patient Active Problem List   Diagnosis Date Noted   Encounter for induction of labor 10/16/2021   Gestational hypertension 10/16/2021   SVD 11/29 10/16/2021   Postpartum care following vaginal delivery 11/29 10/16/2021   First degree perineal laceration 10/16/2021    Date of discharge: 10/17/2021   Discharge diagnosis: Principal Problem:   Postpartum care following vaginal delivery 11/29 Active Problems:   Encounter for induction of labor   Gestational hypertension   SVD 11/29   First degree perineal laceration                                                            Post partum procedures: None  Augmentation: AROM and Pitocin Pain control: Epidural  Laceration:1st degree  Episiotomy:None  Complications: None  Hospital course:  Induction of Labor With Vaginal Delivery   30 y.o. yo [id number] at [redacted]w[redacted]d was admitted to the hospital 10/16/2021 for induction of labor.  Indication for induction: Gestational hypertension.  Patient had an uncomplicated labor course as follows: Membrane Rupture Time/Date: 9:44 AM ,10/16/2021   Delivery Method:Vaginal, Spontaneous  Episiotomy: None  Lacerations:  1st degree  Details of delivery can be found in separate delivery note.  Patient had a routine postpartum course. Patient is discharged home 10/17/21.  Newborn Data: Birth date:10/16/2021  Birth time:3:41 PM  Gender:Female  Living status:Living  Apgars:8 ,9  Weight:2980 g   Physical exam  Vitals:   10/16/21 2359 10/17/21 0321 10/17/21 0345 10/17/21 0548  BP: 126/75  (!) 142/75 (!) 114/59  Pulse: (!) 56 68 68 (!) 50  Resp:      Temp:      TempSrc:      SpO2:      Weight:      Height:       General: alert,  cooperative, and no distress Lochia: appropriate Uterine Fundus: firm Incision: N/A Perineum: repair intact, no edema DVT Evaluation: No evidence of DVT seen on physical exam. No significant calf/ankle edema. Labs: Lab Results  Component Value Date   WBC 19.5 (H) 10/17/2021   HGB 11.3 (L) 10/17/2021   HCT 33.2 (L) 10/17/2021   MCV 89.0 10/17/2021   PLT 208 10/17/2021   CMP Latest Ref Rng & Units 10/16/2021  Glucose 70 - 99 mg/dL 92  BUN 6 - 20 mg/dL 7  Creatinine 4.27 - 0.62 mg/dL 3.76(E)  Sodium 831 - 517 mmol/L 134(L)  Potassium 3.5 - 5.1 mmol/L 3.7  Chloride 98 - 111 mmol/L 106  CO2 22 - 32 mmol/L 20(L)  Calcium 8.9 - 10.3 mg/dL 8.2(L)  Total Protein 6.5 - 8.1 g/dL 6.0(L)  Total Bilirubin 0.3 - 1.2 mg/dL 0.3  Alkaline Phos 38 - 126 U/L 90  AST 15 - 41 U/L 17  ALT 0 - 44 U/L 16   Edinburgh Postnatal Depression Scale Screening Tool 04/24/2019  I have been able to laugh and see the funny side of things. 0  I have looked forward with enjoyment to things. 0  I have blamed myself unnecessarily when things went wrong. 1  I  have been anxious or worried for no good reason. 1  I have felt scared or panicky for no good reason. 0  Things have been getting on top of me. 1  I have been so unhappy that I have had difficulty sleeping. 0  I have felt sad or miserable. 0  I have been so unhappy that I have been crying. 0  The thought of harming myself has occurred to me. 0  Edinburgh Postnatal Depression Scale Total 3   Discharge instructions:  per After Visit Summary  After Visit Meds:  Allergies as of 10/17/2021   No Known Allergies      Medication List     STOP taking these medications    acetaminophen 325 MG tablet Commonly known as: TYLENOL   BABY ASPIRIN PO   cephALEXin 500 MG capsule Commonly known as: Keflex   hydroxyprogesterone caproate 250 mg/mL Oil injection Commonly known as: MAKENA       TAKE these medications    ibuprofen 600 MG tablet Commonly  known as: ADVIL Take 1 tablet (600 mg total) by mouth every 6 (six) hours.   prenatal multivitamin Tabs tablet Take 1 tablet by mouth daily at 12 noon.       Diet: routine diet  Activity: Advance as tolerated. Pelvic rest for 6 weeks.   Newborn Data: Live born female  Birth Weight: 6 lb 9.1 oz (2980 g) APGAR: 8, 9  Newborn Delivery   Birth date/time: 10/16/2021 15:41:00 Delivery type: Vaginal, Spontaneous      Named Caroline Baby Feeding: Breast Disposition:home with mother  Delivery Report:  Review the Delivery Report for details.    Follow up:  Follow-up Information     Vick Frees, MD Follow up on 10/22/2021.   Specialty: Obstetrics and Gynecology Why: For blood pressure check. Contact information: [address] [place] [address] [phone]                Clancy Gourd, MSN 10/17/2021, 12:31 PM

## 2021-10-17 NOTE — Anesthesia Postprocedure Evaluation (Signed)
Anesthesia Post Note  Patient: Kendra Meyer  Procedure(s) Performed: AN AD HOC LABOR EPIDURAL     Patient location during evaluation: Mother Baby Anesthesia Type: Epidural Level of consciousness: awake and alert and oriented Pain management: satisfactory to patient Vital Signs Assessment: post-procedure vital signs reviewed and stable Respiratory status: respiratory function stable Cardiovascular status: stable Postop Assessment: no headache, no backache, epidural receding, patient able to bend at knees, no signs of nausea or vomiting, adequate PO intake and able to ambulate Anesthetic complications: no   No notable events documented.  Last Vitals:  Vitals:   10/17/21 0345 10/17/21 0548  BP: (!) 142/75 (!) 114/59  Pulse: 68 (!) 50  Resp:    Temp:    SpO2:      Last Pain:  Vitals:   10/17/21 0743  TempSrc:   PainSc: 0-No pain   Pain Goal:                   Karleen Dolphin

## 2021-10-27 ENCOUNTER — Telehealth (HOSPITAL_COMMUNITY): Payer: Self-pay

## 2021-10-27 NOTE — Telephone Encounter (Signed)
  No answer. Left message to return nurse call.  Marcelino Duster [facility] 10/27/2021,1611

## 2022-10-10 IMAGING — US US RENAL
1 series · 15 of 25 positions shown · non-contrast
Comparison: None.

CLINICAL DATA: Left flank pain, 30 weeks pregnant

EXAM:
RENAL / URINARY TRACT ULTRASOUND COMPLETE

[Series 1: us renal · 40 acquisitions, 15 frames shown]
[im 1/40]
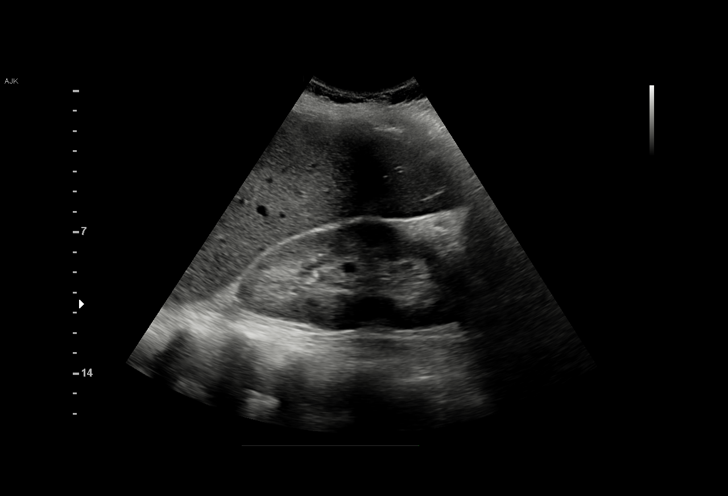
[im 4/40]
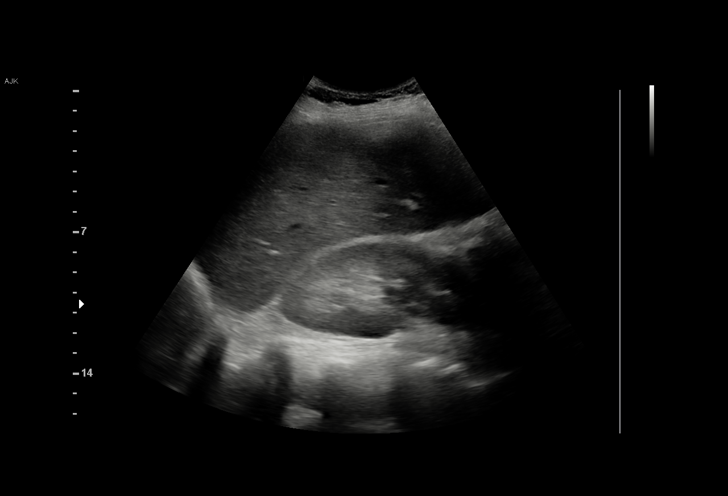
[im 7/40]
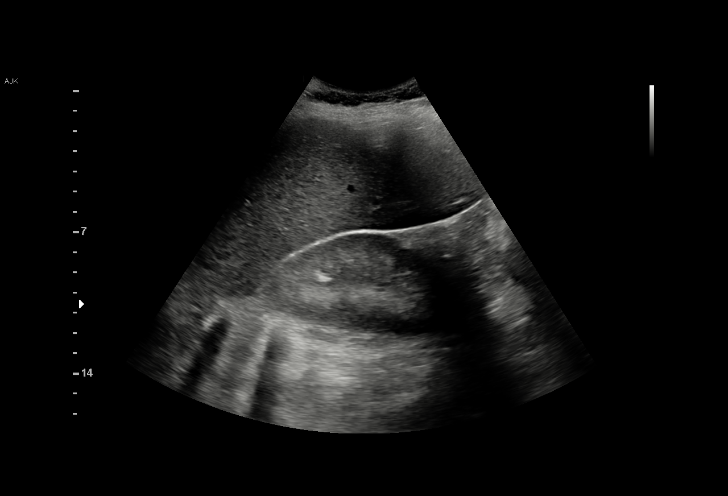
[im 9/40]
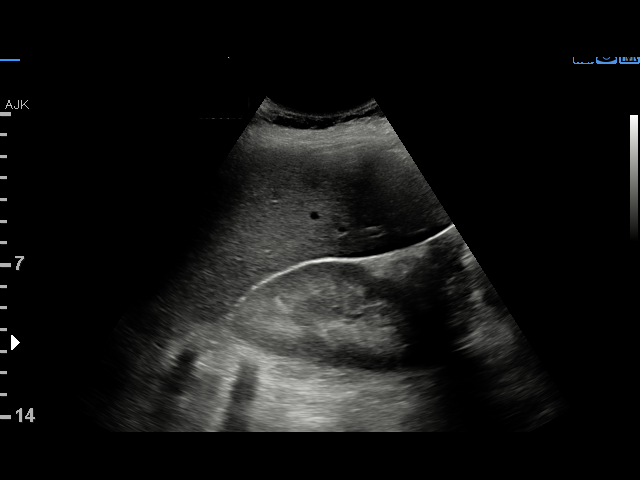
[im 12/40]
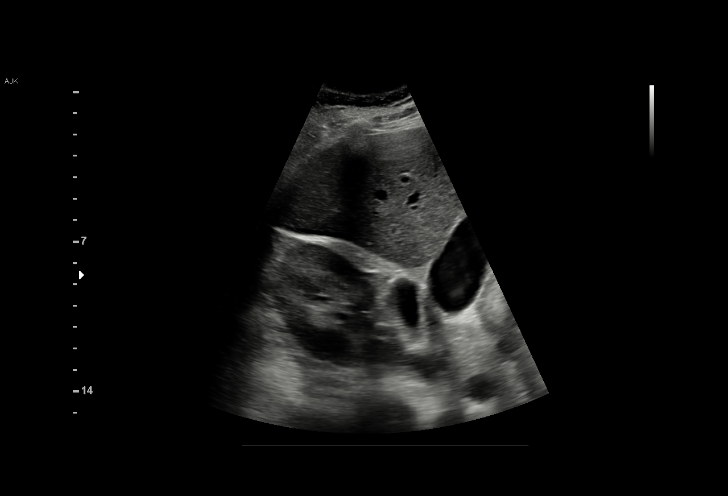
[im 15/40]
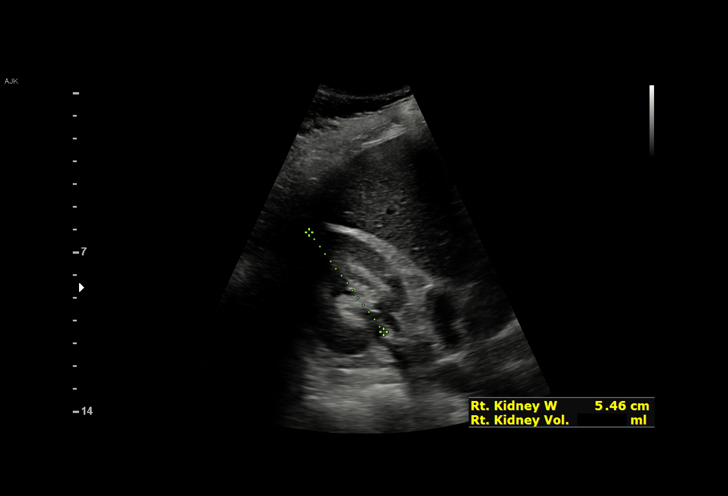
[im 17/40]
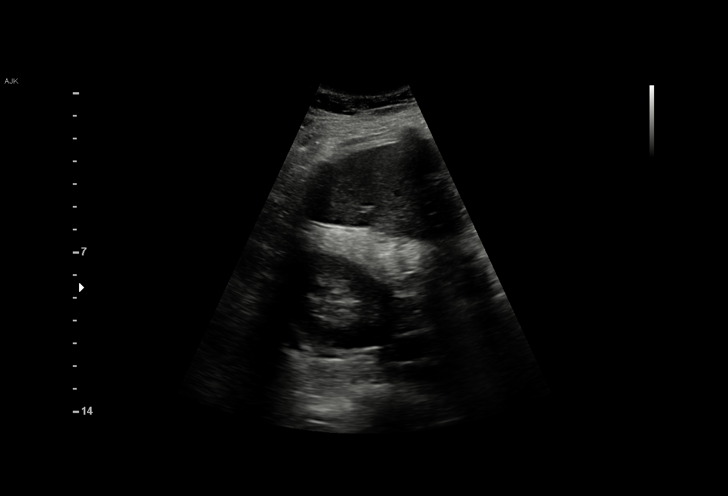
[im 20/40]
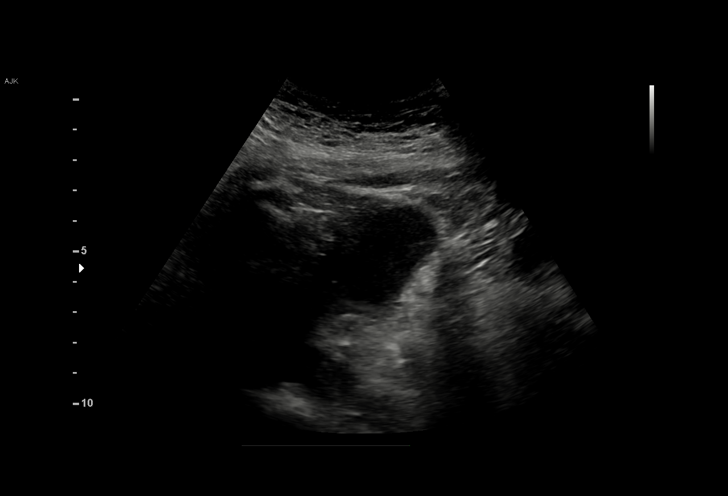
[im 23/40]
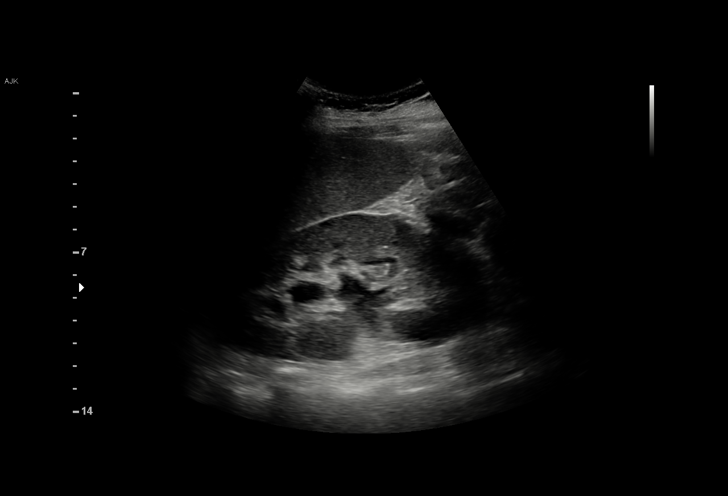
[im 25/40]
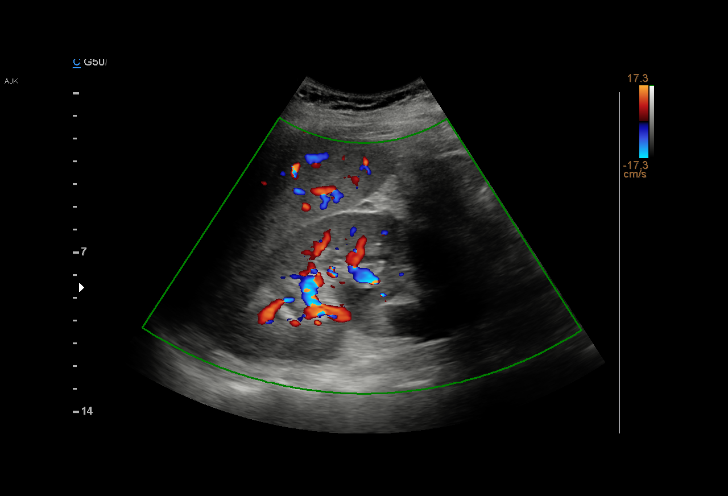
[im 28/40]
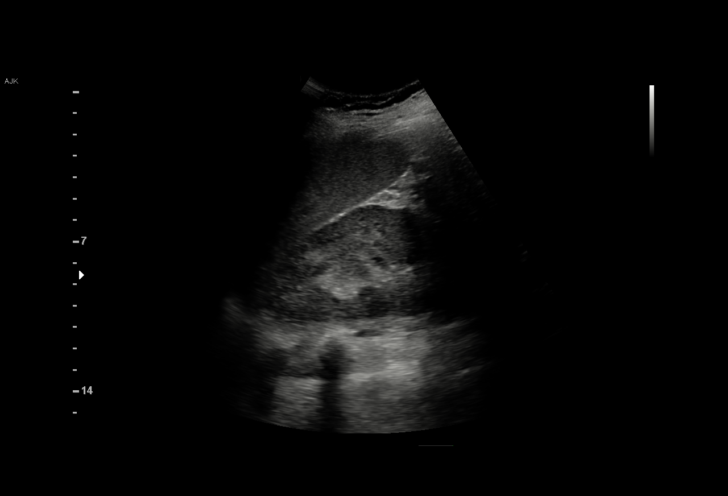
[im 31/40]
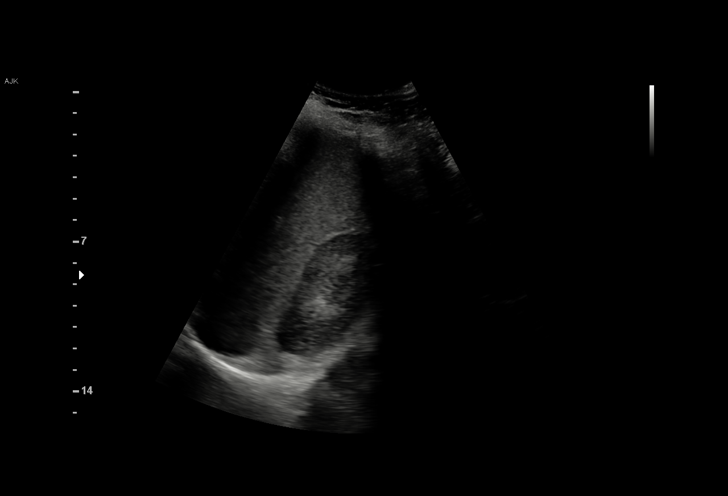
[im 33/40]
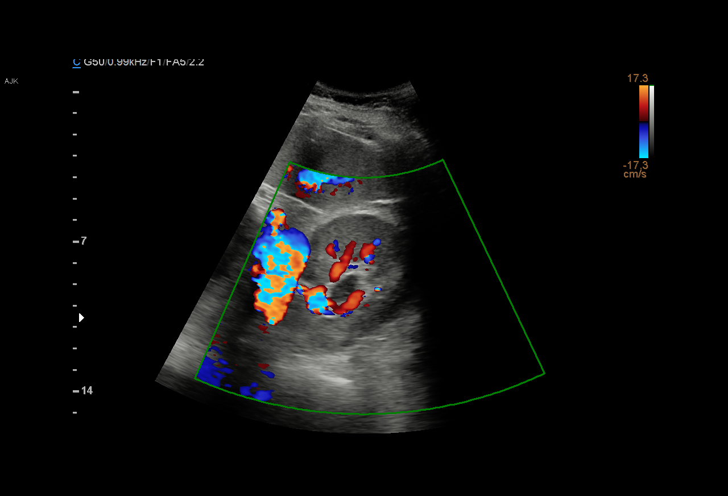
[im 36/40]
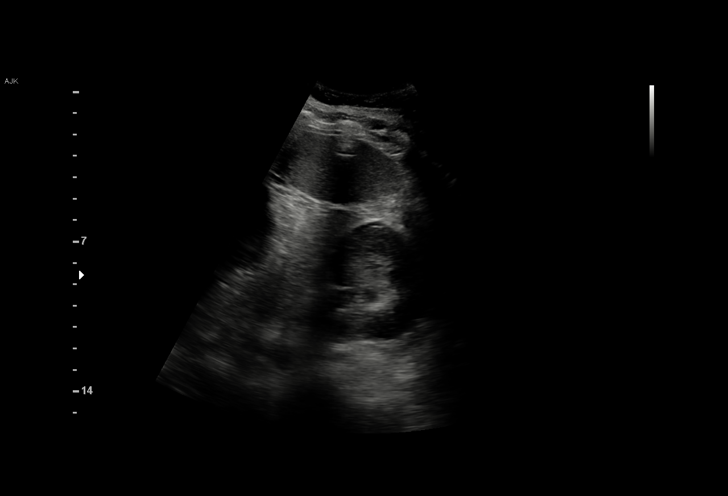
[im 40/40]
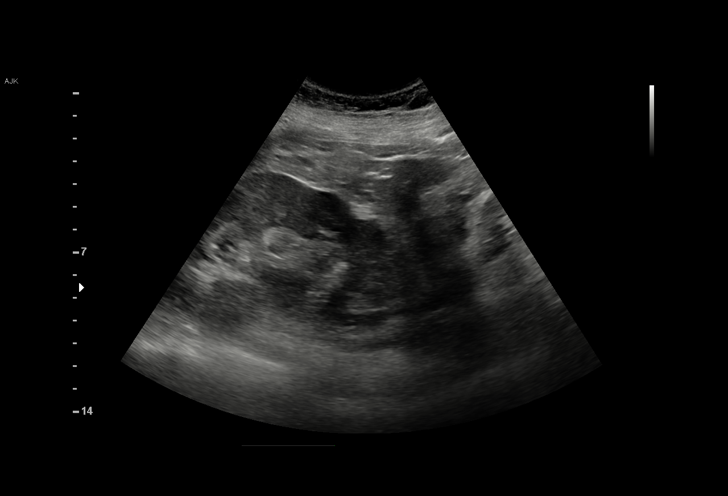

[15 of 25 positions shown; findings below may reference images not displayed]

FINDINGS: Right Kidney:

Renal measurements: 11.7 x 5.4 x 5.5 cm = volume: 179 mL.
Echogenicity within normal limits. No mass or hydronephrosis
visualized.

Left Kidney:

Renal measurements: 12.3 x 5.7 x 5.6 cm = volume: 205 mL. There is
normal renal cortical echogenicity and preserved cortical thickness.
There is mild left hydronephrosis present. No intrarenal masses or
calculi are identified.

Bladder:

The bladder is largely decompressed.

Other:

None.
IMPRESSION: Mild left hydronephrosis.  Preserved cortical thickness.

## 2023-02-24 ENCOUNTER — Encounter (HOSPITAL_COMMUNITY): Payer: Self-pay | Admitting: Obstetrics and Gynecology

## 2023-02-24 ENCOUNTER — Inpatient Hospital Stay (HOSPITAL_COMMUNITY)
Admission: AD | Admit: 2023-02-24 | Discharge: 2023-02-24 | Disposition: A | Discharge status: Home / Self Care | Payer: Commercial Managed Care - PPO | Attending: Obstetrics and Gynecology | Admitting: Obstetrics and Gynecology

## 2023-02-24 ENCOUNTER — Inpatient Hospital Stay (HOSPITAL_COMMUNITY): Payer: Commercial Managed Care - PPO

## 2023-02-24 DIAGNOSIS — O26891 Other specified pregnancy related conditions, first trimester: Secondary | ICD-10-CM | POA: Diagnosis not present

## 2023-02-24 DIAGNOSIS — O10911 Unspecified pre-existing hypertension complicating pregnancy, first trimester: Secondary | ICD-10-CM | POA: Insufficient documentation

## 2023-02-24 DIAGNOSIS — O039 Complete or unspecified spontaneous abortion without complication: Secondary | ICD-10-CM | POA: Diagnosis not present

## 2023-02-24 DIAGNOSIS — Z3A10 10 weeks gestation of pregnancy: Secondary | ICD-10-CM | POA: Insufficient documentation

## 2023-02-24 DIAGNOSIS — O034 Incomplete spontaneous abortion without complication: Secondary | ICD-10-CM | POA: Insufficient documentation

## 2023-02-24 DIAGNOSIS — N939 Abnormal uterine and vaginal bleeding, unspecified: Secondary | ICD-10-CM

## 2023-02-24 LAB — TYPE AND SCREEN
ABO/RH(D): A POS
Antibody Screen: NEGATIVE

## 2023-02-24 LAB — CBC WITH DIFFERENTIAL/PLATELET
Abs Immature Granulocytes: 0.13 10*3/uL — ABNORMAL HIGH (ref 0.00–0.07)
Basophils Absolute: 0.1 10*3/uL (ref 0.0–0.1)
Basophils Relative: 0 %
Eosinophils Absolute: 0 10*3/uL (ref 0.0–0.5)
Eosinophils Relative: 0 %
HCT: 38.4 % (ref 36.0–46.0)
Hemoglobin: 13.3 g/dL (ref 12.0–15.0)
Immature Granulocytes: 1 %
Lymphocytes Relative: 5 %
Lymphs Abs: 1.3 10*3/uL (ref 0.7–4.0)
MCH: 29.4 pg (ref 26.0–34.0)
MCHC: 34.6 g/dL (ref 30.0–36.0)
MCV: 85 fL (ref 80.0–100.0)
Monocytes Absolute: 1.3 10*3/uL — ABNORMAL HIGH (ref 0.1–1.0)
Monocytes Relative: 5 %
Neutro Abs: 24.1 10*3/uL — ABNORMAL HIGH (ref 1.7–7.7)
Neutrophils Relative %: 89 %
Platelets: 247 10*3/uL (ref 150–400)
RBC: 4.52 MIL/uL (ref 3.87–5.11)
RDW: 12.5 % (ref 11.5–15.5)
WBC: 27 10*3/uL — ABNORMAL HIGH (ref 4.0–10.5)
nRBC: 0 % (ref 0.0–0.2)

## 2023-02-24 MED ORDER — MORPHINE SULFATE (PF) 4 MG/ML IV SOLN
4.0000 mg | Freq: Once | INTRAVENOUS | Status: AC
  Administered 2023-02-24: 4 mg via INTRAVENOUS
  Filled 2023-02-24: qty 1

## 2023-02-24 MED ORDER — HYDROMORPHONE HCL 1 MG/ML IJ SOLN
INTRAMUSCULAR | Status: AC
  Administered 2023-02-24: 1 mg via INTRAVENOUS
  Filled 2023-02-24: qty 1

## 2023-02-24 MED ORDER — LACTATED RINGERS IV BOLUS
1000.0000 mL | Freq: Once | INTRAVENOUS | Status: AC
  Administered 2023-02-24: 1000 mL via INTRAVENOUS

## 2023-02-24 MED ORDER — HYDROMORPHONE HCL 1 MG/ML IJ SOLN: 1.0000 mg | Freq: Once | INTRAMUSCULAR | Status: AC

## 2023-02-24 NOTE — MAU Provider Note (Signed)
History     CSN: [id number]  Arrival date and time: 02/24/23 1656   Event Date/Time   First Provider Initiated Contact with Patient 02/24/23 1737      No chief complaint on file.  Kendra Meyer , a  32 y.o. [phone] at [redacted]w[redacted]d presents to MAU for increased pain and bleeding following a SAB. Patient states around 1:30pm she began to have cramping and then felt a gush. She believes her water broke. At 3pm She states she went to the bathroom and noted bleeding starting. She states shortly after that she passed the fetus in the toilet. She is unsure if she passed placenta at home. She states she continued to bleed and having abdominal pain. She states she saturated a pad at home and bled through her clothes in the car on the way here.  She complaints of pain more on the right pelvis and currently rates pain a 8/10. She denies attempting to relive pain. She denies deviating from her routine today, denies drug use or increased amounts of stress. She believes she passed out in the car on the way over. She endorses nausea, and chills. But denies other cold and flu-like symptoms.   RN called to get patient from lobby, patient "feeling like passing out" upon arrival. Patient soaked through her clothes in triage and significant amount of bleeding noted in wheelchair.          OB History     Gravida  4   Para  2   Term  1   Preterm  1   AB  1   Living  2      SAB  1   IAB      Ectopic      Multiple  0   Live Births  2           Past Medical History:  Diagnosis Date   Headache    Hypertension    Medical history non-contributory     Past Surgical History:  Procedure Laterality Date   COLONOSCOPY     NO PAST SURGERIES      Family History  Problem Relation Age of Onset   Cancer Father        colon   Cancer Paternal Grandmother        breast   Cancer Paternal Grandfather        esophageal   Alcohol abuse Brother     Social History   Tobacco Use   Smoking  status: Never   Smokeless tobacco: Never  Vaping Use   Vaping Use: Never used  Substance Use Topics   Alcohol use: Never   Drug use: Never    Allergies: No Known Allergies  Medications Prior to Admission  Medication Sig Dispense Refill Last Dose   ibuprofen (ADVIL) 600 MG tablet Take 1 tablet (600 mg total) by mouth every 6 (six) hours. 30 tablet 0    Prenatal Vit-Fe Fumarate-FA (PRENATAL MULTIVITAMIN) TABS tablet Take 1 tablet by mouth daily at 12 noon.       Review of Systems  Constitutional:  Positive for chills and fatigue. Negative for fever.  Eyes:  Negative for pain and visual disturbance.  Respiratory:  Negative for apnea, shortness of breath and wheezing.   Cardiovascular:  Negative for chest pain and palpitations.  Gastrointestinal:  Positive for constipation. Negative for abdominal pain, diarrhea, nausea and vomiting.  Genitourinary:  Positive for pelvic pain, vaginal bleeding and vaginal pain. Negative for difficulty urinating, dysuria  and vaginal discharge.  Musculoskeletal:  Negative for back pain.  Skin:  Positive for color change.  Neurological:  Positive for dizziness, weakness and light-headedness. Negative for seizures and headaches.  Psychiatric/Behavioral:  Negative for suicidal ideas.    Physical Exam   unknown if currently breastfeeding.  Physical Exam Vitals and nursing note reviewed. Exam conducted with a chaperone present.  Constitutional:      General: She is in acute distress.     Appearance: She is ill-appearing.     Comments: Patient appears very pale  HENT:     Head: Normocephalic.  Cardiovascular:     Rate and Rhythm: Normal rate.  Pulmonary:     Effort: Pulmonary effort is normal.  Abdominal:     Palpations: Abdomen is soft.     Tenderness: There is abdominal tenderness. There is guarding.  Genitourinary:    Vagina: Bleeding present.     Comments: Placenta-like structure removed from vaginal vault. 3-4 med sized clots removed with  ring forceps. Bleeding now minimal.  Musculoskeletal:     Cervical back: Normal range of motion.  Skin:    General: Skin is warm and dry.  Neurological:     Mental Status: She is alert and oriented to person, place, and time.  Psychiatric:        Mood and Affect: Mood normal.     MAU Course  Procedures Orders Placed This Encounter  Procedures   US OB LESS THAN 14 WEEKS WITH OB TRANSVAGINAL   CBC with Differential/Platelet   Type and screen [facility]   Meds ordered this encounter  Medications   lactated ringers bolus 1,000 mL   HYDROmorphone (DILAUDID) injection 1 mg   HYDROmorphone (DILAUDID) 1 MG/ML injection    Roney MansWilson, Kathrine M: cabinet override   morphine (PF) 4 MG/ML injection 4 mg   Results for orders placed or performed during the hospital encounter of 02/24/23 (from the past 48 hour(s))  Type and screen [facility]     Status: None   Collection Time: 02/24/23  5:28 PM  Result Value Ref Range   ABO/RH(D) A POS    Antibody Screen NEG    Sample Expiration      02/27/2023,2359 Performed at [facility], [address] [place], [place] [address]   CBC with Differential/Platelet     Status: Abnormal   Collection Time: 02/24/23  5:33 PM  Result Value Ref Range   WBC 27.0 (H) 4.0 - 10.5 K/uL   RBC 4.52 3.87 - 5.11 MIL/uL   Hemoglobin 13.3 12.0 - 15.0 g/dL   HCT 02.738.4 25.336.0 - 66.446.0 %   MCV 85.0 80.0 - 100.0 fL   MCH 29.4 26.0 - 34.0 pg   MCHC 34.6 30.0 - 36.0 g/dL   RDW 40.312.5 47.411.5 - 25.915.5 %   Platelets 247 150 - 400 K/uL   nRBC 0.0 0.0 - 0.2 %   Neutrophils Relative % 89 %   Neutro Abs 24.1 (H) 1.7 - 7.7 K/uL   Lymphocytes Relative 5 %   Lymphs Abs 1.3 0.7 - 4.0 K/uL   Monocytes Relative 5 %   Monocytes Absolute 1.3 (H) 0.1 - 1.0 K/uL   Eosinophils Relative 0 %   Eosinophils Absolute 0.0 0.0 - 0.5 K/uL   Basophils Relative 0 %   Basophils Absolute 0.1 0.0 - 0.1 K/uL   WBC Morphology MORPHOLOGY UNREMARKABLE    RBC  Morphology MORPHOLOGY UNREMARKABLE    Smear Review PLATELET  COUNT CONFIRMED BY SMEAR    Immature Granulocytes 1 %   Abs Immature Granulocytes 0.13 (H) 0.00 - 0.07 K/uL    Comment: Performed at [facility], [address], [place], [place] [address]   US OB Transvaginal  Result Date: 02/24/2023 CLINICAL DATA:  Vaginal bleeding EXAM: TRANSVAGINAL OB ULTRASOUND TECHNIQUE: Transvaginal ultrasound was performed for complete evaluation of the gestation as well as the maternal uterus, adnexal regions, and pelvic cul-de-sac. COMPARISON:  None Available. FINDINGS: Intrauterine gestational sac: None Endometrium: Thickened and heterogeneous measuring 3.6 cm in thickness. No vascular flow seen in the endometrium. Maternal uterus/adnexae: Bilateral ovaries are visualized and appear within normal limits. No pelvic free fluid. IMPRESSION: 1. No intrauterine gestational sac identified. Thickened heterogeneous endometrium likely related to blood products from completed abortion. Given the amount of thickening, retained products of conception can not be excluded despite lack of vascularity in this region. Electronically Signed   By: Darliss Cheney M.D.   On: 02/24/2023 19:42      MDM - IV bolus initiated  - Elevated white count, but patients baseline is elevated.  - VS stable. Afebrile, low suspicion for infection.   - Pain still 8/10 post dilaudid. 4mg  IV morphine ordered. - Hgb 13.3 with platelet count 247. Patient hemodynamically stable.  - Bleeding scant since exam.  - Contents removed sent to path.  - US imaging independently by me. Thickened endometrium observed consistent with recent SAB. Dr. Adrian Blackwater also reviewed images, and agrees that thickened endometrium likely related to SAB but stable for discharge.  - Dr. Billy Coast notified of patient presentation and current clinical picture. Recommended that patient have follow up in office in 1-2 weeks. Dr. Billy Coast agrees and request that patient be seen in the  office tomorrow.  - Plan for discharge.   Assessment and Plan   1. SAB (spontaneous abortion)   2. [redacted] weeks gestation of pregnancy   3. Vaginal bleeding    - Reviewed worsening signs and return precautions.  - Reccommended that patient be seen in the office tomorrow. Patient agreeable to plan of care.  - Patient desires to take fetus home. Consents singed. Fetus sent home with patient. Contents removed from cervix sent to path.  - Patient discharged home instable conditions and may return to MAU as needed.    Claudette Head, MSN CNM  02/24/2023, 5:37 PM

## 2023-02-24 NOTE — MAU Note (Signed)
.  Kendra Meyer is a 32 y.o. at Unknown here in MAU reporting: around 110pm had some cramping. Water broke  and then she started bleeding and cramping and then fetus came out.  Pt has been still having cramping and bleeding since. Pt felt like she was going to pass ou in lobby. Pt was pale and diaphoretic . Assisted to room and bed. Pad was saturated  LMP:  Onset of complaint: 110pm Pain score: 8 There were no vitals filed for this visit.   FHT:n/a Lab orders placed from triage:

## 2023-02-24 NOTE — MAU Note (Signed)
[name],CNM at bedside did SSE. Large clot removed and placenta. Small amount of bleeding noted after. Kendra Meyer

## 2023-02-24 NOTE — MAU Note (Addendum)
Pt still c/o cramping. MS ordered and given. Vaginal; bleeding minimal. Just smears on pad/

## 2023-02-26 LAB — SURGICAL PATHOLOGY

## 2023-11-19 NOTE — L&D Delivery Note (Signed)
 Delivery Note At 1807 a viable and healthy female was delivered over intact perineum via svd  (Presentation: cephalic; OA ).  APGAR: 8,9 ; weight  not yet done Placenta status: delivered, spontaneously, intact  Cord: 3vv,  with the following complications: none.  Anesthesia:  epidural Episiotomy:  none Lacerations:  small 2nd degree, hemostatic after repair Suture Repair: 3.0 vicryl Est. Blood Loss (mL):   Mom to postpartum.  Baby to Couplet care / Skin to Skin.  Kendra Meyer Brave 09/03/2024, 6:26 PM

## 2024-02-09 ENCOUNTER — Other Ambulatory Visit: Payer: Self-pay | Admitting: Medical Genetics

## 2024-02-25 LAB — OB RESULTS CONSOLE GC/CHLAMYDIA
Chlamydia: NEGATIVE
Neisseria Gonorrhea: NEGATIVE

## 2024-02-25 LAB — OB RESULTS CONSOLE HEPATITIS B SURFACE ANTIGEN: Hepatitis B Surface Ag: NEGATIVE

## 2024-02-25 LAB — OB RESULTS CONSOLE RPR: RPR: NONREACTIVE

## 2024-02-25 LAB — OB RESULTS CONSOLE RUBELLA ANTIBODY, IGM: Rubella: IMMUNE

## 2024-02-25 LAB — OB RESULTS CONSOLE HIV ANTIBODY (ROUTINE TESTING): HIV: NONREACTIVE

## 2024-03-13 ENCOUNTER — Encounter: Payer: Self-pay | Admitting: Emergency Medicine

## 2024-03-13 ENCOUNTER — Ambulatory Visit
Admission: EM | Admit: 2024-03-13 | Discharge: 2024-03-13 | Disposition: A | Discharge status: Home / Self Care | Attending: Physician Assistant | Admitting: Physician Assistant

## 2024-03-13 DIAGNOSIS — J069 Acute upper respiratory infection, unspecified: Secondary | ICD-10-CM

## 2024-03-13 LAB — POC COVID19/FLU A&B COMBO
Covid Antigen, POC: NEGATIVE
Influenza A Antigen, POC: NEGATIVE
Influenza B Antigen, POC: NEGATIVE

## 2024-03-13 NOTE — ED Provider Notes (Signed)
 [facility]    CSN: [id number] Arrival date & time: 03/13/24  5638      History   Chief Complaint Chief Complaint  Patient presents with   Cough    HPI Kasy Doke is a 33 y.o. female.   Patient here for evaluation of sore throat x 2 days.  Admits nasal congestion, rhinorrhea, cough.  She is [redacted] weeks pregnant.  She had some nausea and vomiting last night, but none today.  Took tylenol  last night, none today.     Past Medical History:  Diagnosis Date   Headache    Hypertension    Medical history non-contributory     Patient Active Problem List   Diagnosis Date Noted   Encounter for induction of labor 10/16/2021   Gestational hypertension 10/16/2021   SVD 11/29 10/16/2021   Postpartum care following vaginal delivery 11/29 10/16/2021   First degree perineal laceration 10/16/2021    Past Surgical History:  Procedure Laterality Date   COLONOSCOPY     NO PAST SURGERIES      OB History     Gravida  5   Para  2   Term  1   Preterm  1   AB  1   Living  2      SAB  1   IAB      Ectopic      Multiple  0   Live Births  2            Home Medications    Prior to Admission medications   Medication Sig Start Date End Date Taking? Authorizing Provider  ibuprofen  (ADVIL ) 600 MG tablet Take 1 tablet (600 mg total) by mouth every 6 (six) hours. 10/17/21   Jones, Amanda K, CNM  Prenatal Vit-Fe Fumarate-FA (PRENATAL MULTIVITAMIN) TABS tablet Take 1 tablet by mouth daily at 12 noon.    [provider]    Family History Family History  Problem Relation Age of Onset   Cancer Father        colon   Cancer Paternal Grandmother        breast   Cancer Paternal Grandfather        esophageal   Alcohol abuse Brother     Social History Social History   Tobacco Use   Smoking status: Never   Smokeless tobacco: Never  Vaping Use   Vaping status: Never Used  Substance Use Topics   Alcohol use: Never   Drug use: Never      Allergies   Patient has no known allergies.   Review of Systems Review of Systems  Constitutional:  Positive for fatigue. Negative for chills and fever.  HENT:  Positive for congestion, rhinorrhea and sore throat. Negative for ear pain, nosebleeds, postnasal drip, sinus pressure and sinus pain.   Eyes:  Negative for pain and redness.  Respiratory:  Positive for cough. Negative for shortness of breath and wheezing.   Gastrointestinal:  Positive for nausea and vomiting. Negative for abdominal pain and diarrhea.  Musculoskeletal:  Negative for arthralgias and myalgias.  Skin:  Negative for rash.  Neurological:  Negative for light-headedness and headaches.  Hematological:  Negative for adenopathy. Does not bruise/bleed easily.  Psychiatric/Behavioral:  Negative for confusion and sleep disturbance.      Physical Exam Triage Vital Signs ED Triage Vitals [03/13/24 0832]  Encounter Vitals Group     BP 130/85     Systolic BP Percentile      Diastolic BP Percentile  Pulse Rate 76     Resp 17     Temp 98.4 F (36.9 C)     Temp Source Oral     SpO2 98 %     Weight      Height      Head Circumference      Peak Flow      Pain Score      Pain Loc      Pain Education      Exclude from Growth Chart    No data found.  Updated Vital Signs BP 130/85 (BP Location: Right Arm)   Pulse 76   Temp 98.4 F (36.9 C) (Oral)   Resp 17   SpO2 98%   Visual Acuity Right Eye Distance:   Left Eye Distance:   Bilateral Distance:    Right Eye Near:   Left Eye Near:    Bilateral Near:     Physical Exam Vitals and nursing note reviewed.  Constitutional:      General: She is not in acute distress.    Appearance: Normal appearance. She is not ill-appearing.  HENT:     Head: Normocephalic and atraumatic.     Right Ear: Tympanic membrane and ear canal normal.     Left Ear: Tympanic membrane and ear canal normal.     Nose: Rhinorrhea present. No mucosal edema or congestion.  Rhinorrhea is clear.     Mouth/Throat:     Pharynx: Uvula midline. No pharyngeal swelling, oropharyngeal exudate, posterior oropharyngeal erythema or postnasal drip.     Tonsils: No tonsillar exudate or tonsillar abscesses.  Eyes:     General: No scleral icterus.    Extraocular Movements: Extraocular movements intact.     Conjunctiva/sclera: Conjunctivae normal.  Cardiovascular:     Rate and Rhythm: Normal rate and regular rhythm.     Heart sounds: No murmur heard. Pulmonary:     Effort: Pulmonary effort is normal. No respiratory distress.     Breath sounds: Normal breath sounds. No wheezing or rales.     Comments: Speaking in full and complete sentences Musculoskeletal:     Cervical back: Normal range of motion. No rigidity.  Lymphadenopathy:     Cervical: No cervical adenopathy.  Skin:    Capillary Refill: Capillary refill takes less than 2 seconds.     Coloration: Skin is not jaundiced.     Findings: No rash.  Neurological:     General: No focal deficit present.     Mental Status: She is alert and oriented to person, place, and time.     Motor: No weakness.     Gait: Gait normal.  Psychiatric:        Mood and Affect: Mood normal.        Behavior: Behavior normal.      UC Treatments / Results  Labs (all labs ordered are listed, but only abnormal results are displayed) Labs Reviewed  POC COVID19/FLU A&B COMBO - Normal    EKG   Radiology No results found.  Procedures Procedures (including critical care time)  Medications Ordered in UC Medications - No data to display  Initial Impression / Assessment and Plan / UC Course  I have reviewed the triage vital signs and the nursing notes.  Pertinent labs & imaging results that were available during my care of the patient were reviewed by me and considered in my medical decision making (see chart for details).     Sx consistent with viral URI VSS NAD Return  precautions provided Drink plenty of water Get  plenty of rest Final Clinical Impressions(s) / UC Diagnoses   Final diagnoses:  Viral URI with cough     Discharge Instructions      Take tylenol  as needed for sore throat, fever Follow up with PCP or return if symptoms fail to improve or you experience new or worsening symptoms    ED Prescriptions   None    PDMP not reviewed this encounter.   Lavonia Powers, PA-C 03/13/24 [phone]

## 2024-03-13 NOTE — Discharge Instructions (Addendum)
 Take tylenol  as needed for sore throat, fever Follow up with PCP or return if symptoms fail to improve or you experience new or worsening symptoms

## 2024-03-13 NOTE — ED Triage Notes (Addendum)
 Pt c/o sore throat, cough, headache, and chest discomfort began Thursday. Yesterday she threw up.  She is [redacted]  weeks pregnant

## 2024-04-12 ENCOUNTER — Other Ambulatory Visit: Payer: Self-pay

## 2024-04-12 ENCOUNTER — Ambulatory Visit: Admission: EM | Admit: 2024-04-12 | Discharge: 2024-04-12 | Disposition: A | Discharge status: Home / Self Care

## 2024-04-12 DIAGNOSIS — R21 Rash and other nonspecific skin eruption: Secondary | ICD-10-CM | POA: Diagnosis not present

## 2024-04-12 MED ORDER — TRIAMCINOLONE ACETONIDE 0.1 % EX CREA: 1.0000 | TOPICAL_CREAM | Freq: Two times a day (BID) | CUTANEOUS | 0 refills | Status: AC

## 2024-04-12 NOTE — ED Provider Notes (Signed)
 Kendra Meyer    CSN: [id number] Arrival date & time: 04/12/24  1917      History   Chief Complaint Chief Complaint  Patient presents with   Rash    HPI Kendra Meyer is a 33 y.o. female.   HPI  Patient reports concerns for rash She states she was outside gardening on Thursday and got a cut on the dorsum of her right hand She does not think she was exposed to poison oak or ivy. She reports her children were outside with her and are not showing similar symptoms She reports the rash seems to be getting worse and seems to be spreading She denies itching with rash  She reports area on her left upper arm does burn a little bit with palpation  She denies drainage, bleeding She reports right hand has been swelling a bit  She is currently pregnant - 17 weeks  Interventions: she has tried Cortizone cream and benadryl  but this did not lead to improvement    Past Medical History:  Diagnosis Date   Headache    Hypertension    Medical history non-contributory     Patient Active Problem List   Diagnosis Date Noted   Encounter for induction of labor 10/16/2021   Gestational hypertension 10/16/2021   SVD 11/29 10/16/2021   Postpartum care following vaginal delivery 11/29 10/16/2021   First degree perineal laceration 10/16/2021    Past Surgical History:  Procedure Laterality Date   COLONOSCOPY     NO PAST SURGERIES      OB History     Gravida  5   Para  2   Term  1   Preterm  1   AB  1   Living  2      SAB  1   IAB      Ectopic      Multiple  0   Live Births  2            Home Medications    Prior to Admission medications   Medication Sig Start Date End Date Taking? Authorizing Provider  aspirin  EC 81 MG tablet Take 81 mg by mouth daily. Swallow whole.   Yes [provider]  triamcinolone cream (KENALOG) 0.1 % Apply 1 Application topically 2 (two) times daily for 7 days. 04/12/24 04/19/24 Yes Rigby Swamy E, PA-C   cephALEXin  (KEFLEX ) 500 MG capsule Take 1 tablet by mouth daily.    [provider]  ibuprofen  (ADVIL ) 600 MG tablet Take 1 tablet (600 mg total) by mouth every 6 (six) hours. 10/17/21   Jones, Amanda K, CNM  Prenatal Vit-Fe Fumarate-FA (PRENATAL MULTIVITAMIN) TABS tablet Take 1 tablet by mouth daily at 12 noon.    [provider]    Family History Family History  Problem Relation Age of Onset   Cancer Father        colon   Alcohol abuse Brother    Cancer Paternal Grandmother        breast   Cancer Paternal Grandfather        esophageal    Social History Social History   Tobacco Use   Smoking status: Never   Smokeless tobacco: Never  Vaping Use   Vaping status: Never Used  Substance Use Topics   Alcohol use: Never   Drug use: Never     Allergies   Patient has no known allergies.   Review of Systems Review of Systems  HENT:  Negative for facial  swelling and trouble swallowing.   Respiratory:  Negative for chest tightness, shortness of breath and wheezing.   Skin:  Positive for rash.     Physical Exam Triage Vital Signs ED Triage Vitals  Encounter Vitals Group     BP 04/12/24 1930 130/71     Systolic BP Percentile --      Diastolic BP Percentile --      Pulse Rate 04/12/24 1930 72     Resp 04/12/24 1930 18     Temp 04/12/24 1930 97.7 F (36.5 C)     Temp Source 04/12/24 1930 Oral     SpO2 04/12/24 1930 97 %     Weight 04/12/24 1932 163 lb (73.9 kg)     Height 04/12/24 1932 5\' 4"  (1.626 m)     Head Circumference --      Peak Flow --      Pain Score 04/12/24 1932 0     Pain Loc --      Pain Education --      Exclude from Growth Chart --    No data found.  Updated Vital Signs BP 130/71 (BP Location: Right Arm)   Pulse 72   Temp 97.7 F (36.5 C) (Oral)   Resp 18   Ht 5\' 4"  (1.626 m)   Wt 163 lb (73.9 kg)   SpO2 97%   Breastfeeding No   BMI 27.98 kg/m   Visual Acuity Right Eye Distance:   Left Eye Distance:   Bilateral  Distance:    Right Eye Near:   Left Eye Near:    Bilateral Near:     Physical Exam Vitals reviewed.  Constitutional:      Appearance: Normal appearance.  HENT:     Head: Normocephalic and atraumatic.  Skin:    General: Skin is warm and dry.     Findings: Rash present. Rash is macular, papular and scaling. Rash is not crusting, nodular, purpuric, pustular, urticarial or vesicular.       Neurological:     Mental Status: She is alert.      Meyer Treatments / Results  Labs (all labs ordered are listed, but only abnormal results are displayed) Labs Reviewed - No data to display  EKG   Radiology No results found.  Procedures Procedures (including critical care time)  Medications Ordered in Meyer Medications - No data to display  Initial Impression / Assessment and Plan / Meyer Course  I have reviewed the triage vital signs and the nursing notes.  Pertinent labs & imaging results that were available during my care of the patient were reviewed by me and considered in my medical decision making (see chart for details).      Final Clinical Impressions(s) / Meyer Diagnoses   Final diagnoses:  Rash and nonspecific skin eruption   Patient presents today with concerns of rash present on left upper arm, left side of the face, right side of the chest and right hand dorsum.  She states that this has been ongoing for about 4 days and has not improved with cortisone cream or Benadryl .  She denies persistent itchiness but states that there is some burning and discomfort.  Physical exam reveals what appears to be 2 different kinds of rashes.  Area on the face and chest are erythematous and overall macular in nature.  No signs of swelling, bleeding, purulent drainage.  Areas on the left upper arm and dorsum of the right hand are flesh-colored and papular.  No signs  of swelling, bleeding, purulent drainage from these locations either.  Rashes do not appear consistent with contact dermatitis from  poison oak or ivy.  Unsure if this is from insect bite or another kind of contact dermatitis from plants.  Given the fact the patient is pregnant will try topical Kenalog 0.1% cream to assist with symptoms.  Recommend she continues taking Benadryl  as desired for further management.  ED and return precautions reviewed and provided in after visit summary.  Follow-up as needed.    Discharge Instructions      You were seen today for concerns of a rash on several areas of your body At this time I am not sure what is causing your symptoms but I do recommend a few things to help with resolving it.  I have sent in a medication called Kenalog for you to use as needed up to twice per day.  This is a steroid cream that typically helps with inflammation and itching.  Please try not to use it on the face or the same area of the skin for more than 2 weeks as it can cause skin thinning or skin changes.  You can continue to use Benadryl  as needed for further itching and symptomatic management.  If at any point you notice redness, drainage that looks like pus, swelling, increased warmth please return here or follow-up with your primary care provider for further evaluation and ongoing management.   ED Prescriptions     Medication Sig Dispense Auth. Provider   triamcinolone cream (KENALOG) 0.1 % Apply 1 Application topically 2 (two) times daily for 7 days. 30 g Zephan Beauchaine E, PA-C      PDMP not reviewed this encounter.   Jerona Mooring, PA-C 04/12/24 2021

## 2024-04-12 NOTE — ED Triage Notes (Addendum)
 Pt presents with complaints of generalized rash. Pt states she was working out in the yard, pulling weeds on Thursday 5/22. Rash started on the right hand and has now spread to face, neck, and left arm. Pt currently denies pain, minimal to no itching. Hydrocortisone cream applied + Benadryl  taken with no improvement.

## 2024-04-12 NOTE — Discharge Instructions (Signed)
 You were seen today for concerns of a rash on several areas of your body At this time I am not sure what is causing your symptoms but I do recommend a few things to help with resolving it.  I have sent in a medication called Kenalog for you to use as needed up to twice per day.  This is a steroid cream that typically helps with inflammation and itching.  Please try not to use it on the face or the same area of the skin for more than 2 weeks as it can cause skin thinning or skin changes.  You can continue to use Benadryl  as needed for further itching and symptomatic management.  If at any point you notice redness, drainage that looks like pus, swelling, increased warmth please return here or follow-up with your primary care provider for further evaluation and ongoing management.

## 2024-08-20 LAB — OB RESULTS CONSOLE GBS: GBS: NEGATIVE

## 2024-08-23 ENCOUNTER — Encounter (HOSPITAL_COMMUNITY): Payer: Self-pay | Admitting: Obstetrics and Gynecology

## 2024-08-23 ENCOUNTER — Inpatient Hospital Stay (HOSPITAL_COMMUNITY)
Admission: AD | Admit: 2024-08-23 | Discharge: 2024-08-23 | Disposition: A | Discharge status: Home / Self Care | Attending: Obstetrics and Gynecology | Admitting: Obstetrics and Gynecology

## 2024-08-23 DIAGNOSIS — Z3A36 36 weeks gestation of pregnancy: Secondary | ICD-10-CM

## 2024-08-23 DIAGNOSIS — O4703 False labor before 37 completed weeks of gestation, third trimester: Secondary | ICD-10-CM | POA: Insufficient documentation

## 2024-08-23 DIAGNOSIS — O133 Gestational [pregnancy-induced] hypertension without significant proteinuria, third trimester: Secondary | ICD-10-CM | POA: Diagnosis not present

## 2024-08-23 DIAGNOSIS — O479 False labor, unspecified: Secondary | ICD-10-CM

## 2024-08-23 LAB — COMPREHENSIVE METABOLIC PANEL WITH GFR
ALT: 16 U/L (ref 0–44)
AST: 14 U/L — ABNORMAL LOW (ref 15–41)
Albumin: 3.1 g/dL — ABNORMAL LOW (ref 3.5–5.0)
Alkaline Phosphatase: 112 U/L (ref 38–126)
Anion gap: 13 (ref 5–15)
BUN: 6 mg/dL (ref 6–20)
CO2: 19 mmol/L — ABNORMAL LOW (ref 22–32)
Calcium: 8.7 mg/dL — ABNORMAL LOW (ref 8.9–10.3)
Chloride: 104 mmol/L (ref 98–111)
Creatinine, Ser: 0.54 mg/dL (ref 0.44–1.00)
GFR, Estimated: >60 mL/min (ref >60)
Glucose, Bld: 85 mg/dL (ref 70–99)
Potassium: 3.5 mmol/L (ref 3.5–5.1)
Sodium: 136 mmol/L (ref 135–145)
Total Bilirubin: 0.6 mg/dL (ref 0.0–1.2)
Total Protein: 6.5 g/dL (ref 6.5–8.1)

## 2024-08-23 LAB — CBC
HCT: 35.9 % — ABNORMAL LOW (ref 36.0–46.0)
Hemoglobin: 12.3 g/dL (ref 12.0–15.0)
MCH: 29.8 pg (ref 26.0–34.0)
MCHC: 34.3 g/dL (ref 30.0–36.0)
MCV: 86.9 fL (ref 80.0–100.0)
Platelets: 220 K/uL (ref 150–400)
RBC: 4.13 MIL/uL (ref 3.87–5.11)
RDW: 13.7 % (ref 11.5–15.5)
WBC: 19.7 K/uL — ABNORMAL HIGH (ref 4.0–10.5)
nRBC: 0 % (ref 0.0–0.2)

## 2024-08-23 LAB — PROTEIN / CREATININE RATIO, URINE
Creatinine, Urine: 42 mg/dL
Protein Creatinine Ratio: 0.17 mg/mg{creat} — ABNORMAL HIGH (ref 0.00–0.15)
Total Protein, Urine: 7 mg/dL

## 2024-08-23 NOTE — Discharge Instructions (Addendum)
 Reasons to return to MAU at [facility]: You have more frequent contractions, each last 1 minute, and you cannot walk or talk during them. Your water breaks.  Sometimes it is a big gush of fluid. However, many times it may it may be much more subtle. You should go to the hospital if you have a constant leakage of fluid from your vagina, enough to soak a pad when you are walking around.  You have vaginal bleeding.  It is normal to have a small amount of spotting if your cervix was checked. If you have bleeding requiring the use of a pad, go to the hospital. You don't feel your baby moving like normal.  If you think that you baby's movement is decreased, eat a snack and rest on your left side in a quiet room for one hour. If you have not felt the baby move more than 6 times in an hour GO TO THE HOSPITAL.     The [employer] This circuit takes at least 90 minutes to complete so clear your schedule and make mental preparations so you can relax in your environment.  The second step requires a lot of pillows so gather them up before beginning.  Before starting, you should empty your bladder! Have a nice drink nearby, and make sure it has a straw! If you are having contractions, this circuit should be done through contractions, try not to change positions between steps.  Step One: Open-knee Chest Stay in this position for 30 minutes, start in cat/cow, then drop your chest as low as you can to the bed or the floor and your bottom as high as you can. Knees should be fairly wide apart, and the angle between the torso/thighs should be wider than 90 degrees. Wiggle around, prop with lots of pillows and use this time to get totally relaxed. This position allows the baby to scoot out of the pelvis a bit and gives them room to rotate, shift their head position, etc. If the pregnant person finds it helpful, careful positioning with a rebozo under the belly, with gentle  tension from a support person behind can help maintain this position for the full 30 minutes.  Step Two: Exaggerated Left Side Lying Roll to your left side, bringing your top leg as high as possible and keeping your bottom leg straight. Roll forward as much as possible, again using a lot of pillows. Sink into the bed and relax some more. If you fall asleep, that's totally okay and you can stay there! If not, stay here for at least another half an hour. Try and get your top right leg up towards your head and get as rolled over onto your belly as much as possible. If you repeat the circuit during labor, try alternating left and right sides. We know the photo the left is actually right side... just flip the image in your head.  Step Three: Moving and Lunges Lunge, walk stairs facing sideways, 2 at a time, (have a spotter downstairs of you!), take a walk outside with one foot on the curb and the other on the street, sit on a birth ball and hula- anything that's upright and putting your pelvis in open, asymmetrical positions. Spend at least 30 minutes doing this one as well to give your baby a chance to move down. If you are lunging or stair or curb walking, you should lunge/walk/go up stairs in the direction that feels better to you. The  key with the lunge is that the toes of the higher leg and mom's belly button should be at right angles. Do not lunge over your knee, that closes the pelvis.  You got this!!!!!!!!!!!!!!!!!!!!!!! :)

## 2024-08-23 NOTE — MAU Provider Note (Signed)
 Chief Complaint:  Contractions   HPI   Event Date/Time   First Provider Initiated Contact with Patient 08/23/24 1851      Kendra Meyer is a 33 y.o. [id number] at [redacted]w[redacted]d who presents to maternity admissions reporting contractions. Initially a RN labor evaluation, but initial BP was elevated. Denies headache, vision changes, swelling, RUQ pain. BP has been well controlled with diet this pregnancy with gHTN, no medications.   Additional history obtained from partner  Pregnancy Course: Receives care at [facility]. Prenatal records reviewed. Pregnancy complicated by gHTN and history of preeclampsia.  Past Medical History:  Diagnosis Date   Headache    Hypertension    Medical history non-contributory    OB History  Gravida Para Term Preterm AB Living  5 2 1 1 2 2   SAB IAB Ectopic Multiple Live Births  2   0 2    # Outcome Date GA Lbr Len/2nd Weight Sex Type Anes PTL Lv  5 Current           4 Term 10/16/21 [redacted]w[redacted]d 02:48 / 00:19 2980 g F Vag-Spont EPI  LIV  3 Preterm 04/23/19 [redacted]w[redacted]d 07:44 / 03:15 2705 g F Vag-Vacuum EPI  LIV  2 SAB           1 SAB            Past Surgical History:  Procedure Laterality Date   COLONOSCOPY     NO PAST SURGERIES     Family History  Problem Relation Age of Onset   Cancer Father        colon   Alcohol abuse Brother    Cancer Paternal Grandmother        breast   Cancer Paternal Grandfather        esophageal   Social History   Tobacco Use   Smoking status: Never   Smokeless tobacco: Never  Vaping Use   Vaping status: Never Used  Substance Use Topics   Alcohol use: Never   Drug use: Never   Allergies  Allergen Reactions   Benzoyl Peroxide Other (See Comments)   Medications Prior to Admission  Medication Sig Dispense Refill Last Dose/Taking   aspirin  EC 81 MG tablet Take 81 mg by mouth daily. Swallow whole.   08/22/2024   cephALEXin  (KEFLEX ) 500 MG capsule Take 1 tablet by mouth daily.   08/22/2024   Prenatal Vit-Fe Fumarate-FA  (PRENATAL MULTIVITAMIN) TABS tablet Take 1 tablet by mouth daily at 12 noon.   08/22/2024   ibuprofen  (ADVIL ) 600 MG tablet Take 1 tablet (600 mg total) by mouth every 6 (six) hours. 30 tablet 0     I have reviewed patient's Past Medical Hx, Surgical Hx, Family Hx, Social Hx, medications and allergies.   ROS  Pertinent items noted in HPI and remainder of comprehensive ROS otherwise negative.   PHYSICAL EXAM  Patient Vitals for the past 24 hrs:  BP Temp Temp src Pulse Resp SpO2 Weight  08/23/24 2030 (!) 109/55 -- -- 76 -- 99 % --  08/23/24 2000 132/71 -- -- 67 -- 99 % --  08/23/24 1945 118/67 -- -- 76 -- 97 % --  08/23/24 1930 134/74 -- -- 92 -- 97 % --  08/23/24 1915 128/75 -- -- 77 -- 97 % --  08/23/24 1901 130/87 -- -- 79 -- 96 % --  08/23/24 1847 135/83 -- -- 85 -- 95 % --  08/23/24 1837 (!) 137/93 97.8 F (36.6 C) Oral 95 18 96 % --  08/23/24 1827 -- -- -- -- -- -- 83.7 kg   Constitutional: Well-developed, well-nourished female in no acute distress.  HEENT: atraumatic, normocephalic. Neck has normal ROM. EOM intact. Cardiovascular: normal rate & rhythm, warm and well-perfused Respiratory: normal effort, no problems with respiration noted GI: Abd soft, non-tender, non-distended MSK: Extremities nontender, no edema, normal ROM Skin: warm and dry. Acyanotic, no jaundice or pallor. Neurologic: Alert and oriented x 4. No abnormal coordination. Psychiatric: Normal mood. Speech not slurred, not rapid/pressured. Patient is cooperative. GU: no CVA tenderness  Dilation: 3 Effacement (%): 70, 80 Cervical Position: Posterior Station: -3 Presentation: Undeterminable Exam by:: Griselda Punter, RN  Fetal Tracing: Baseline FHR: 140 per minute Fetal heart variability: moderate Fetal Heart Rate accelerations: yes Fetal Heart Rate decelerations: none Fetal Non-stress Test: Category I (reactive) Toco: uterine contractions every 4 minutes  Labs: Results for orders placed or performed  during the hospital encounter of 08/23/24 (from the past 24 hours)  Protein / creatinine ratio, urine     Status: Abnormal   Collection Time: 08/23/24  6:52 PM  Result Value Ref Range   Creatinine, Urine 42 mg/dL   Total Protein, Urine 7 mg/dL   Protein Creatinine Ratio 0.17 (H) 0.00 - 0.15 mg/mg[Cre]  CBC     Status: Abnormal   Collection Time: 08/23/24  7:52 PM  Result Value Ref Range   WBC 19.7 (H) 4.0 - 10.5 K/uL   RBC 4.13 3.87 - 5.11 MIL/uL   Hemoglobin 12.3 12.0 - 15.0 g/dL   HCT 64.0 (L) 63.9 - 53.9 %   MCV 86.9 80.0 - 100.0 fL   MCH 29.8 26.0 - 34.0 pg   MCHC 34.3 30.0 - 36.0 g/dL   RDW 86.2 88.4 - 84.4 %   Platelets 220 150 - 400 K/uL   nRBC 0.0 0.0 - 0.2 %  Comprehensive metabolic panel with GFR     Status: Abnormal   Collection Time: 08/23/24  7:52 PM  Result Value Ref Range   Sodium 136 135 - 145 mmol/L   Potassium 3.5 3.5 - 5.1 mmol/L   Chloride 104 98 - 111 mmol/L   CO2 19 (L) 22 - 32 mmol/L   Glucose, Bld 85 70 - 99 mg/dL   BUN 6 6 - 20 mg/dL   Creatinine, Ser 9.45 0.44 - 1.00 mg/dL   Calcium  8.7 (L) 8.9 - 10.3 mg/dL   Total Protein 6.5 6.5 - 8.1 g/dL   Albumin 3.1 (L) 3.5 - 5.0 g/dL   AST 14 (L) 15 - 41 U/L   ALT 16 0 - 44 U/L   Alkaline Phosphatase 112 38 - 126 U/L   Total Bilirubin 0.6 0.0 - 1.2 mg/dL   GFR, Estimated >39 >39 mL/min   Anion gap 13 5 - 15    Imaging:  No results found.  MDM & MAU COURSE  MDM: High  MAU Course: -Initial BP elevated, normalized after initial check.  -CMP, CBC, urine protein/creatinine ratio to rule out preeclampsia.  -BP has remained within normal limits. -CBC within normal limits, no thrombocytopenia.  -urine protein/creatinine ratio within normal limits. -CMP within normal limits. -Ruled out preeclampsia, continue with RN labor eval. -No change in cervical dilation on repeat exam.  Orders Placed This Encounter  Procedures   OB RESULT CONSOLE Group B Strep   CBC   Comprehensive metabolic panel with GFR    Protein / creatinine ratio, urine   Contraction - monitoring   External fetal heart monitoring   Vaginal  exam   Discharge patient   No orders of the defined types were placed in this encounter.   ASSESSMENT   1. Uterine contractions   2. Gestational hypertension, third trimester   3. [redacted] weeks gestation of pregnancy     PLAN  Discharge home in stable condition with return precautions.       Allergies as of 08/23/2024       Reactions   Benzoyl Peroxide Other (See Comments)        Medication List     STOP taking these medications    cephALEXin  500 MG capsule Commonly known as: KEFLEX    ibuprofen  600 MG tablet Commonly known as: ADVIL        TAKE these medications    aspirin  EC 81 MG tablet Take 81 mg by mouth daily. Swallow whole.   prenatal multivitamin Tabs tablet Take 1 tablet by mouth daily at 12 noon.        Joesph DELENA Sear, PA

## 2024-08-23 NOTE — MAU Note (Signed)
..  Kendra Meyer is a 33 y.o. at [redacted]w[redacted]d here in MAU reporting: started contracting around 1530 this evening. She states they are now every 3-4 minutes. Denies VB or LOF. +FM.   Pain score: 7 Vitals:   08/23/24 1837  BP: (!) 137/93  Pulse: 95  Resp: 18  SpO2: 96%      Lab orders placed from triage:   mau labor

## 2024-09-03 ENCOUNTER — Inpatient Hospital Stay (HOSPITAL_COMMUNITY): Admitting: Anesthesiology

## 2024-09-03 ENCOUNTER — Inpatient Hospital Stay (HOSPITAL_COMMUNITY)
Admission: AD | Admit: 2024-09-03 | Discharge: 2024-09-05 | DRG: 807 | Disposition: A | Discharge status: Home / Self Care | Payer: Self-pay | Attending: Obstetrics | Admitting: Obstetrics

## 2024-09-03 ENCOUNTER — Encounter (HOSPITAL_COMMUNITY): Payer: Self-pay | Admitting: Obstetrics

## 2024-09-03 ENCOUNTER — Other Ambulatory Visit: Payer: Self-pay

## 2024-09-03 DIAGNOSIS — Z7982 Long term (current) use of aspirin: Secondary | ICD-10-CM | POA: Diagnosis not present

## 2024-09-03 DIAGNOSIS — O26893 Other specified pregnancy related conditions, third trimester: Secondary | ICD-10-CM | POA: Diagnosis present

## 2024-09-03 DIAGNOSIS — Z3A37 37 weeks gestation of pregnancy: Secondary | ICD-10-CM

## 2024-09-03 DIAGNOSIS — O3663X0 Maternal care for excessive fetal growth, third trimester, not applicable or unspecified: Secondary | ICD-10-CM | POA: Diagnosis present

## 2024-09-03 LAB — CBC
HCT: 36 % (ref 36.0–46.0)
Hemoglobin: 12.6 g/dL (ref 12.0–15.0)
MCH: 30.2 pg (ref 26.0–34.0)
MCHC: 35 g/dL (ref 30.0–36.0)
MCV: 86.3 fL (ref 80.0–100.0)
Platelets: 216 K/uL (ref 150–400)
RBC: 4.17 MIL/uL (ref 3.87–5.11)
RDW: 13.5 % (ref 11.5–15.5)
WBC: 19.2 K/uL — ABNORMAL HIGH (ref 4.0–10.5)
nRBC: 0 % (ref 0.0–0.2)

## 2024-09-03 LAB — URINALYSIS, ROUTINE W REFLEX MICROSCOPIC
Bilirubin Urine: NEGATIVE
Glucose, UA: NEGATIVE mg/dL
Ketones, ur: NEGATIVE mg/dL
Nitrite: NEGATIVE
Protein, ur: NEGATIVE mg/dL
Specific Gravity, Urine: 1.003 — ABNORMAL LOW (ref 1.005–1.030)
pH: 7 (ref 5.0–8.0)

## 2024-09-03 LAB — TYPE AND SCREEN
ABO/RH(D): A POS
Antibody Screen: NEGATIVE

## 2024-09-03 MED ORDER — LACTATED RINGERS IV SOLN: INTRAVENOUS | Status: DC

## 2024-09-03 MED ORDER — FLEET ENEMA RE ENEM: 1.0000 | ENEMA | RECTAL | Status: DC | PRN

## 2024-09-03 MED ORDER — OXYTOCIN-SODIUM CHLORIDE 30-0.9 UT/500ML-% IV SOLN
2.5000 [IU]/h | INTRAVENOUS | Status: DC
  Filled 2024-09-03: qty 500

## 2024-09-03 MED ORDER — ONDANSETRON HCL 4 MG PO TABS: 4.0000 mg | ORAL_TABLET | ORAL | Status: DC | PRN

## 2024-09-03 MED ORDER — WITCH HAZEL-GLYCERIN EX PADS
1.0000 | MEDICATED_PAD | CUTANEOUS | Status: DC | PRN
  Administered 2024-09-03: 1 via TOPICAL

## 2024-09-03 MED ORDER — IBUPROFEN 600 MG PO TABS
600.0000 mg | ORAL_TABLET | Freq: Four times a day (QID) | ORAL | Status: DC
  Administered 2024-09-03 – 2024-09-05 (×6): 600 mg via ORAL
  Filled 2024-09-03 (×6): qty 1

## 2024-09-03 MED ORDER — PHENYLEPHRINE 80 MCG/ML (10ML) SYRINGE FOR IV PUSH (FOR BLOOD PRESSURE SUPPORT): 80.0000 ug | PREFILLED_SYRINGE | INTRAVENOUS | Status: DC | PRN

## 2024-09-03 MED ORDER — BUPIVACAINE HCL (PF) 0.25 % IJ SOLN
INTRAMUSCULAR | Status: DC | PRN
  Administered 2024-09-03: 10 mL via EPIDURAL

## 2024-09-03 MED ORDER — LIDOCAINE HCL (PF) 1 % IJ SOLN: 30.0000 mL | INTRAMUSCULAR | Status: DC | PRN

## 2024-09-03 MED ORDER — TETANUS-DIPHTH-ACELL PERTUSSIS 5-2-15.5 LF-MCG/0.5 IM SUSP: 0.5000 mL | Freq: Once | INTRAMUSCULAR | Status: DC

## 2024-09-03 MED ORDER — DIPHENHYDRAMINE HCL 50 MG/ML IJ SOLN: 12.5000 mg | INTRAMUSCULAR | Status: DC | PRN

## 2024-09-03 MED ORDER — LIDOCAINE HCL (PF) 1 % IJ SOLN
INTRAMUSCULAR | Status: DC | PRN
  Administered 2024-09-03 (×2): 4 mL via EPIDURAL

## 2024-09-03 MED ORDER — EPHEDRINE 5 MG/ML INJ: 10.0000 mg | INTRAVENOUS | Status: DC | PRN

## 2024-09-03 MED ORDER — ONDANSETRON HCL 4 MG/2ML IJ SOLN: 4.0000 mg | INTRAMUSCULAR | Status: DC | PRN

## 2024-09-03 MED ORDER — ACETAMINOPHEN 325 MG PO TABS: 650.0000 mg | ORAL_TABLET | ORAL | Status: DC | PRN

## 2024-09-03 MED ORDER — FENTANYL-BUPIVACAINE-NACL 0.5-0.125-0.9 MG/250ML-% EP SOLN
12.0000 mL/h | EPIDURAL | Status: DC | PRN
  Administered 2024-09-03: 12 mL/h via EPIDURAL
  Filled 2024-09-03: qty 250

## 2024-09-03 MED ORDER — ONDANSETRON HCL 4 MG/2ML IJ SOLN: 4.0000 mg | Freq: Four times a day (QID) | INTRAMUSCULAR | Status: DC | PRN

## 2024-09-03 MED ORDER — SOD CITRATE-CITRIC ACID 500-334 MG/5ML PO SOLN: 30.0000 mL | ORAL | Status: DC | PRN

## 2024-09-03 MED ORDER — DIBUCAINE (PERIANAL) 1 % EX OINT: 1.0000 | TOPICAL_OINTMENT | CUTANEOUS | Status: DC | PRN

## 2024-09-03 MED ORDER — COCONUT OIL OIL
1.0000 | TOPICAL_OIL | Status: DC | PRN
  Administered 2024-09-05: 1 via TOPICAL

## 2024-09-03 MED ORDER — TRANEXAMIC ACID-NACL 1000-0.7 MG/100ML-% IV SOLN
INTRAVENOUS | Status: AC
  Administered 2024-09-03: 1000 mg
  Filled 2024-09-03: qty 100

## 2024-09-03 MED ORDER — DIPHENHYDRAMINE HCL 25 MG PO CAPS
25.0000 mg | ORAL_CAPSULE | Freq: Four times a day (QID) | ORAL | Status: DC | PRN
  Administered 2024-09-04: 25 mg via ORAL
  Filled 2024-09-03: qty 1

## 2024-09-03 MED ORDER — OXYTOCIN BOLUS FROM INFUSION
333.0000 mL | Freq: Once | INTRAVENOUS | Status: AC
  Administered 2024-09-03: 333 mL via INTRAVENOUS

## 2024-09-03 MED ORDER — PRENATAL MULTIVITAMIN CH
1.0000 | ORAL_TABLET | Freq: Every day | ORAL | Status: DC
  Administered 2024-09-04: 1 via ORAL
  Filled 2024-09-03: qty 1

## 2024-09-03 MED ORDER — SENNOSIDES-DOCUSATE SODIUM 8.6-50 MG PO TABS
2.0000 | ORAL_TABLET | Freq: Every day | ORAL | Status: DC
  Administered 2024-09-04 – 2024-09-05 (×2): 2 via ORAL
  Filled 2024-09-03 (×2): qty 2

## 2024-09-03 MED ORDER — OXYCODONE-ACETAMINOPHEN 5-325 MG PO TABS: 1.0000 | ORAL_TABLET | ORAL | Status: DC | PRN

## 2024-09-03 MED ORDER — LACTATED RINGERS IV SOLN: 500.0000 mL | Freq: Once | INTRAVENOUS | Status: DC

## 2024-09-03 MED ORDER — OXYCODONE-ACETAMINOPHEN 5-325 MG PO TABS: 2.0000 | ORAL_TABLET | ORAL | Status: DC | PRN

## 2024-09-03 MED ORDER — LACTATED RINGERS IV SOLN: 500.0000 mL | INTRAVENOUS | Status: DC | PRN

## 2024-09-03 MED ORDER — BENZOCAINE-MENTHOL 20-0.5 % EX AERO
1.0000 | INHALATION_SPRAY | CUTANEOUS | Status: DC | PRN
  Administered 2024-09-03: 1 via TOPICAL
  Filled 2024-09-03: qty 56

## 2024-09-03 MED ORDER — SIMETHICONE 80 MG PO CHEW: 80.0000 mg | CHEWABLE_TABLET | ORAL | Status: DC | PRN

## 2024-09-03 NOTE — H&P (Signed)
 Kendra Meyer is a 33 y.o. H4E8877 at [redacted]w[redacted]d gestation presents for complaint of Contractions and LOF. +FM.  Antepartum course: h/o severe pre-eclampsia, baby asa daily; h/o pyelo in pregnancy, keflex  daily, lga 90% 1 day ago, efw 7'15   [facility] at [facility] since 10 wks.  See complete pre-natal records  History OB History     Gravida  5   Para  2   Term  1   Preterm  1   AB  2   Living  2      SAB  2   IAB      Ectopic      Multiple  0   Live Births  2          Past Medical History:  Diagnosis Date   Headache    Hypertension    Medical history non-contributory    Past Surgical History:  Procedure Laterality Date   COLONOSCOPY     NO PAST SURGERIES     Family History: family history includes Alcohol abuse in her brother; Cancer in her father, paternal grandfather, and paternal grandmother. Social History:  reports that she has never smoked. She has never used smokeless tobacco. She reports that she does not drink alcohol and does not use drugs.  ROS: See above otherwise negative  Prenatal labs:  ABO, Rh: --/--/A POS (10/17 1229) Antibody: NEG (10/17 1229) Rubella: Immune (04/09 0000) RPR: Nonreactive (04/09 0000)  HBsAg: Negative (04/09 0000)  HIV:Non-reactive (04/09 0000)  GBS: Negative/-- (10/03 0000)  1 hr Glucola: Normal Genetic screening: Normal Anatomy US : Normal  Physical Exam:   Dilation: 8 Effacement (%): 80 Station: -3 Exam by:: Dr. Linnell Blood pressure 125/70, pulse (!) 56, temperature 98.8 F (37.1 C), temperature source Oral, resp. rate 16, height 5' 4 (1.626 m), weight 85.3 kg, SpO2 97%. A&O x 3 HEENT: Normal Lungs: CTAB CV: RRR Abdominal: Soft, Non-tender, Gravid, and Estimated fetal weight: 7 1/2 - 8 lbs  Lower Extremities: Non-edematous, Non-tender  Pelvic Exam:      Dilatation: 8cm     Effacement: 80%     Station: -3     Presentation: Cephalic; arom with clear fluid  Labs:  CBC:  Lab Results   Component Value Date   WBC 19.2 (H) 09/03/2024   RBC 4.17 09/03/2024   HGB 12.6 09/03/2024   HCT 36.0 09/03/2024   MCV 86.3 09/03/2024   MCH 30.2 09/03/2024   MCHC 35.0 09/03/2024   RDW 13.5 09/03/2024   PLT 216 09/03/2024    Urine: Lab Results  Component Value Date   COLORURINE STRAW (A) 09/03/2024   APPEARANCEUR CLEAR 09/03/2024   LABSPEC 1.003 (L) 09/03/2024   PHURINE 7.0 09/03/2024   GLUCOSEU NEGATIVE 09/03/2024   HGBUR MODERATE (A) 09/03/2024   BILIRUBINUR NEGATIVE 09/03/2024   KETONESUR NEGATIVE 09/03/2024   PROTEINUR NEGATIVE 09/03/2024   NITRITE NEGATIVE 09/03/2024   LEUKOCYTESUR SMALL (A) 09/03/2024     Prenatal Transfer Tool  Maternal Diabetes: No Genetic Screening: Normal Maternal Ultrasounds/Referrals: Normal Fetal Ultrasounds or other Referrals:  None Maternal Substance Abuse:  No Significant Maternal Medications:  None Significant Maternal Lab Results: Group B Strep negative Number of Prenatal Visits:greater than 3 verified prenatal visits Maternal Vaccinations:RSV: Given during pregnancy >/=14 days ago and TDap Other Comments:  None  FHT: 130s, nml variability, +accels, no decels  Assessment/Plan:  33 y.o. H4E8877 at [redacted]w[redacted]d gestation   Active labor - admit, now with arom; plan svd Gbs neg Fetal status  reassuring Nipt low risk/female Lga - ac 99% will have extra help for shoulder dystocia precautions Rh pos RI  H/o pyelo prior pregnancy- kephlex daily  H/o pre-e - has been on baby asa daily  Kendra Meyer E Kendra Meyer 09/03/2024, 4:32 PM

## 2024-09-03 NOTE — MAU Note (Signed)
 Kendra Meyer is a 33 y.o. at [redacted]w[redacted]d here in MAU reporting: she thinks her water broke approximately one hour ago and since than ctxs have been every 2-3 minutes, states fluid clear.  Reports has noted VB with wiping, had cervical exam yesterday during office visit.  Reports +FM.  LMP:  Onset of complaint: today Pain score: 7 Vitals:   09/03/24 1201  BP: 139/89  Pulse: 74  Resp: 20  Temp: 97.7 F (36.5 C)  SpO2: 99%     FHT: 143 bpm  Lab orders placed from triage: UA

## 2024-09-03 NOTE — Anesthesia Procedure Notes (Signed)
 Epidural Patient location during procedure: OB Start time: 09/03/2024 1:12 PM End time: 09/03/2024 1:17 PM  Staffing Anesthesiologist: Peggye Delon Brunswick, MD Performed: anesthesiologist   Preanesthetic Checklist Completed: patient identified, IV checked, risks and benefits discussed, monitors and equipment checked, pre-op evaluation and timeout performed  Epidural Patient position: sitting Prep: DuraPrep and site prepped and draped Patient monitoring: continuous pulse ox and blood pressure Approach: midline Location: L3-L4 Injection technique: LOR saline  Needle:  Needle type: Tuohy  Needle gauge: 17 G Needle length: 9 cm and 9 Needle insertion depth: 4 cm Catheter type: closed end flexible Catheter size: 19 Gauge Catheter at skin depth: 8 cm Test dose: negative  Assessment Events: blood not aspirated, no cerebrospinal fluid, injection not painful, no injection resistance, no paresthesia and negative IV test  Additional Notes The patient has requested an epidural for labor pain management. Risks and benefits including, but not limited to, infection, bleeding, local anesthetic toxicity, headache, hypotension, back pain, block failure, etc. were discussed with the patient. The patient expressed understanding and consented to the procedure. I confirmed that the patient has no bleeding disorders and is not taking blood thinners. I confirmed the patient's last platelet count with the nurse. A time-out was performed immediately prior to the procedure. Please see nursing documentation for vital signs. Sterile technique was used throughout the whole procedure. Once LOR achieved, the epidural catheter threaded easily without resistance. Aspiration of the catheter was negative for blood and CSF. The epidural was dosed slowly and an infusion was started.  1 attempt(s)Reason for block:procedure for pain

## 2024-09-03 NOTE — Anesthesia Preprocedure Evaluation (Signed)
 Anesthesia Evaluation  Patient identified by MRN, date of birth, ID band Patient awake    Reviewed: Allergy & Precautions, NPO status , Patient's Chart, lab work & pertinent test results  History of Anesthesia Complications Negative for: history of anesthetic complications  Airway Mallampati: III  TM Distance: >3 FB Neck ROM: Full    Dental   Pulmonary neg pulmonary ROS   Pulmonary exam normal breath sounds clear to auscultation       Cardiovascular hypertension (gestational), (-) angina (-) Past MI, (-) Cardiac Stents and (-) CABG (-) dysrhythmias  Rhythm:Regular Rate:Normal     Neuro/Psych  Headaches, neg Seizures    GI/Hepatic negative GI ROS, Neg liver ROS,,,  Endo/Other  negative endocrine ROS    Renal/GU negative Renal ROS     Musculoskeletal   Abdominal   Peds  Hematology negative hematology ROS (+) Lab Results      Component                Value               Date                      WBC                      19.2 (H)            09/03/2024                HGB                      12.6                09/03/2024                HCT                      36.0                09/03/2024                MCV                      86.3                09/03/2024                PLT                      216                 09/03/2024              Anesthesia Other Findings Takes baby aspirin   Reproductive/Obstetrics (+) Pregnancy                              Anesthesia Physical Anesthesia Plan  ASA: 2  Anesthesia Plan: Epidural   Post-op Pain Management:    Induction:   PONV Risk Score and Plan:   Airway Management Planned: Natural Airway  Additional Equipment:   Intra-op Plan:   Post-operative Plan:   Informed Consent: I have reviewed the patients History and Physical, chart, labs and discussed the procedure including the risks, benefits and alternatives for the proposed  anesthesia with the patient or authorized representative who has indicated his/her understanding and  acceptance.       Plan Discussed with: Anesthesiologist  Anesthesia Plan Comments: (I have discussed risks of neuraxial anesthesia including but not limited to infection, bleeding, nerve injury, back pain, headache, seizures, and failure of block. Patient denies bleeding disorders and is not currently anticoagulated. Labs have been reviewed. Risks and benefits discussed. All patient's questions answered.  )         Anesthesia Quick Evaluation

## 2024-09-03 NOTE — Plan of Care (Signed)

## 2024-09-04 LAB — CBC
HCT: 32.7 % — ABNORMAL LOW (ref 36.0–46.0)
Hemoglobin: 11.3 g/dL — ABNORMAL LOW (ref 12.0–15.0)
MCH: 30.1 pg (ref 26.0–34.0)
MCHC: 34.6 g/dL (ref 30.0–36.0)
MCV: 87.2 fL (ref 80.0–100.0)
Platelets: 178 K/uL (ref 150–400)
RBC: 3.75 MIL/uL — ABNORMAL LOW (ref 3.87–5.11)
RDW: 13.6 % (ref 11.5–15.5)
WBC: 20.5 K/uL — ABNORMAL HIGH (ref 4.0–10.5)
nRBC: 0 % (ref 0.0–0.2)

## 2024-09-04 LAB — RPR: RPR Ser Ql: NONREACTIVE

## 2024-09-04 LAB — BIRTH TISSUE RECOVERY COLLECTION (PLACENTA DONATION)

## 2024-09-04 NOTE — Anesthesia Postprocedure Evaluation (Signed)
 Anesthesia Post Note  Patient: Kendra Meyer  Procedure(s) Performed: AN AD HOC LABOR EPIDURAL     Patient location during evaluation: Mother Baby Anesthesia Type: Epidural Level of consciousness: awake and alert Pain management: pain level controlled Vital Signs Assessment: post-procedure vital signs reviewed and stable Respiratory status: spontaneous breathing, nonlabored ventilation and respiratory function stable Cardiovascular status: stable Postop Assessment: no headache, no backache and epidural receding Anesthetic complications: no   No notable events documented.  Last Vitals:  Vitals:   09/04/24 0111 09/04/24 0514  BP: 132/75 125/84  Pulse: (!) 55 (!) 57  Resp: 18 18  Temp: 36.8 C 36.7 C  SpO2: 99%     Last Pain:  Vitals:   09/04/24 0519  TempSrc:   PainSc: 3    Pain Goal:                   Katlyn Muldrew

## 2024-09-04 NOTE — Lactation Note (Signed)
 This note was copied from a baby's chart. Lactation Consultation Note  Patient Name: Kendra Meyer Unijb'd Date: 09/04/2024 Age:31 hours Reason for consult: Initial assessment;Early term 37-38.6wks P3, ETI female infant. MOB finished breastfeed infant on her right breast and when LC entered the room, MOB will pillow support, latched infant on her right breast using the cradle hold, infant sustained her latch and was still breastfeeding after 18 minutes when LC left the room. MOB is experienced with breastfeeding see maternal data below. LC reviewed hand expression with breast model. MOB will continue to breastfeed infant by cues, on demand, 8-12 times within 24 hours, skin to skin. LC discussed importance of maternal diet meals and hydration. Maternal Data Has patient been taught Hand Expression?: Yes Does the patient have breastfeeding experience prior to this delivery?: Yes How long did the patient breastfeed?: MOB states  I breastfeed my first child for 9 months and my 2nd child for 15 months.  Feeding Mother's Current Feeding Choice: Breast Milk  LATCH Score Latch: Grasps breast easily, tongue down, lips flanged, rhythmical sucking.  Audible Swallowing: Spontaneous and intermittent  Type of Nipple: Everted at rest and after stimulation  Comfort (Breast/Nipple): Soft / non-tender  Hold (Positioning): Assistance needed to correctly position infant at breast and maintain latch.  LATCH Score: 9   Lactation Tools Discussed/Used    Interventions Interventions: Breast feeding basics reviewed;Assisted with latch;Skin to skin;Hand express;Breast compression;Adjust position;Support pillows;Position options;Education;Guidelines for Milk Supply and Pumping Schedule Handout;LC Services brochure;CDC milk storage guidelines;CDC Guidelines for Breast Pump Cleaning  Discharge Pump: DEBP;Personal  Consult Status Consult Status: Follow-up Date: 09/04/24 Follow-up type:  In-patient    Grayce LULLA Batter 09/04/2024, 1:45 AM

## 2024-09-04 NOTE — Progress Notes (Signed)
 No c/o, nml lochia Breastfeeding Pain controlled  Patient Vitals for the past 24 hrs:  BP Temp Temp src Pulse Resp SpO2  09/04/24 0853 128/78 98.9 F (37.2 C) Oral (!) 59 17 99 %  09/04/24 0514 125/84 98.1 F (36.7 C) Oral (!) 57 18 --  09/04/24 0111 132/75 98.3 F (36.8 C) Oral (!) 55 18 99 %  09/03/24 2128 121/66 98.4 F (36.9 C) Oral 65 17 97 %  09/03/24 2019 127/76 98.7 F (37.1 C) Oral 86 16 100 %  09/03/24 1902 124/63 -- -- 80 17 --  09/03/24 1832 138/75 -- -- 82 14 --  09/03/24 1817 (!) 121/59 98.4 F (36.9 C) Oral 86 16 --  09/03/24 1814 95/66 -- -- 80 -- --  09/03/24 1732 103/67 -- -- -- -- --  09/03/24 1702 123/75 98.8 F (37.1 C) Oral 82 -- --  09/03/24 1632 133/72 -- -- 75 -- --  09/03/24 1602 125/70 -- -- (!) 56 16 --  09/03/24 1532 119/65 -- -- 64 14 --  09/03/24 1502 130/71 98.8 F (37.1 C) Oral 70 16 --  09/03/24 1447 (!) 113/58 -- -- 70 -- --  09/03/24 1442 126/64 -- -- 65 16 --  09/03/24 1439 124/72 -- -- -- -- --  09/03/24 1431 121/67 -- -- (!) 55 14 --  09/03/24 1426 117/70 -- -- 70 16 --  09/03/24 1421 115/86 -- -- -- -- --  09/03/24 1417 (!) 94/58 -- -- 82 14 --  09/03/24 1400 (!) 141/66 -- -- 62 14 --  09/03/24 1346 (!) 126/45 -- -- 69 16 97 %  09/03/24 1342 126/85 -- -- 77 14 --  09/03/24 1337 129/69 -- -- 69 16 --   A&ox3 Nml respirations Abd: soft,nt,nd; fundus firm and below umb LE: no edema,nt bilat     Latest Ref Rng & Units 09/04/2024    5:01 AM 09/03/2024   12:29 PM 08/23/2024    7:52 PM  CBC  WBC 4.0 - 10.5 K/uL 20.5  19.2  19.7   Hemoglobin 12.0 - 15.0 g/dL 88.6  87.3  87.6   Hematocrit 36.0 - 46.0 % 32.7  36.0  35.9   Platelets 150 - 400 K/uL 178  216  220    A/P: ppd1 s/p svd Doing well, d/c home tomorrow Rh pos Rub immune Breastfeeding, girl

## 2024-09-05 MED ORDER — IBUPROFEN 600 MG PO TABS: 600.0000 mg | ORAL_TABLET | Freq: Four times a day (QID) | ORAL | 0 refills | Status: AC

## 2024-09-05 NOTE — Discharge Summary (Signed)
 Postpartum Discharge Summary  Date of Service updated     Patient Name: Kendra Meyer DOB: 01-Sep-1991 MRN: [id number]  Date of admission: 09/03/2024 Delivery date:09/03/2024 Delivering provider: LINNELL PULLING E Date of discharge: 09/05/2024  Admitting diagnosis: Normal labor [O80, Z37.9] Intrauterine pregnancy: [redacted]w[redacted]d     Secondary diagnosis:  Principal Problem:   Postpartum care following vaginal delivery 10/17 Active Problems:   Normal labor   SVD (spontaneous vaginal delivery)   Second degree perineal laceration  Additional problems: none    Discharge diagnosis: Term Pregnancy Delivered                                               Complications: None  Hospital course: Onset of Labor With Vaginal Delivery      33 y.o. yo [id number] at [redacted]w[redacted]d was admitted in Active Labor on 09/03/2024. Labor course was complicated by nothing.  Membrane Rupture Time/Date: 4:22 PM,09/03/2024  Delivery Method:Vaginal, Spontaneous Episiotomy: None Lacerations:  2nd degree;Perineal Patient had a postpartum course complicated by nothing.  She is ambulating, tolerating a regular diet, passing flatus, and urinating well. Patient is discharged home in stable condition on 09/05/24.  Newborn Data: Birth date:09/03/2024 Birth time:6:07 PM Gender:Female Living status:Living Apgars:8 ,9  Weight:3580 g  Immunizations administered: Immunization History  Administered Date(s) Administered    sv, Bivalent, Protein Subunit Rsvpref,pf (Abrysvo) 08/04/2024   Influenza,inj,Quad PF,6+ Mos 08/15/2021   Influenza,inj,quad, With Preservative 08/18/2021   Influenza-Unspecified 09/01/2022, 09/05/2023   Tdap 04/01/2017, 03/23/2019, 08/15/2021, 07/07/2024    Physical exam  Vitals:   09/04/24 0853 09/04/24 1617 09/04/24 1945 09/05/24 0544  BP: 128/78 117/67 123/71 129/78  Pulse: (!) 59 (!) 59 (!) 58 (!) 55  Resp: 17 16 16 18   Temp: 98.9 F (37.2 C) 98.5 F (36.9 C) 98.6 F (37 C) 98.3 F (36.8  C)  TempSrc: Oral Oral Oral Oral  SpO2: 99% 98% 98%   Weight:      Height:       General: alert, cooperative, and no distress Lochia: appropriate Uterine Fundus: firm Incision: N/A DVT Evaluation: No evidence of DVT seen on physical exam. Labs: Lab Results  Component Value Date   WBC 20.5 (H) 09/04/2024   HGB 11.3 (L) 09/04/2024   HCT 32.7 (L) 09/04/2024   MCV 87.2 09/04/2024   PLT 178 09/04/2024      Latest Ref Rng & Units 08/23/2024    7:52 PM  CMP  Glucose 70 - 99 mg/dL 85   BUN 6 - 20 mg/dL 6   Creatinine 9.55 - 8.99 mg/dL 9.45   Sodium 864 - 854 mmol/L 136   Potassium 3.5 - 5.1 mmol/L 3.5   Chloride 98 - 111 mmol/L 104   CO2 22 - 32 mmol/L 19   Calcium  8.9 - 10.3 mg/dL 8.7   Total Protein 6.5 - 8.1 g/dL 6.5   Total Bilirubin 0.0 - 1.2 mg/dL 0.6   Alkaline Phos 38 - 126 U/L 112   AST 15 - 41 U/L 14   ALT 0 - 44 U/L 16    Edinburgh Score:    09/04/2024    6:04 PM  Edinburgh Postnatal Depression Scale Screening Tool  I have been able to laugh and see the funny side of things. 0  I have looked forward with enjoyment to things. 0  I have blamed myself  unnecessarily when things went wrong. 0  I have been anxious or worried for no good reason. 0  I have felt scared or panicky for no good reason. 0  Things have been getting on top of me. 1  I have been so unhappy that I have had difficulty sleeping. 0  I have felt sad or miserable. 0  I have been so unhappy that I have been crying. 0  The thought of harming myself has occurred to me. 0  Edinburgh Postnatal Depression Scale Total 1      After visit meds:  Allergies as of 09/05/2024       Reactions   Benzoyl Peroxide Other (See Comments)        Medication List     STOP taking these medications    aspirin  EC 81 MG tablet   cefdinir 300 MG capsule Commonly known as: OMNICEF       TAKE these medications    ibuprofen  600 MG tablet Commonly known as: ADVIL  Take 1 tablet (600 mg total) by mouth  every 6 (six) hours.   prenatal multivitamin Tabs tablet Take 1 tablet by mouth daily at 12 noon.         Discharge home in stable condition Infant Feeding: Breast Infant Disposition:home with mother Discharge instruction: per After Visit Summary and Postpartum booklet. Activity: Advance as tolerated. Pelvic rest for 6 weeks.  Diet: routine diet Anticipated Birth Control: Unsure Postpartum Appointment:6 weeks Future Appointments:No future appointments. Follow up Visit:  Follow-up Information     Shia Delaine, Devere BRAVO, MD Follow up.   Specialty: Obstetrics and Gynecology Contact information: [address] [place] [address] [phone]                     09/05/2024 Devere BRAVO Brave, MD

## 2024-09-05 NOTE — Lactation Note (Signed)
 This note was copied from a baby's chart. Lactation Consultation Note  Patient Name: Girl Alishia Lebo Unijb'd Date: 09/05/2024 Age:31 hours Reason for consult: Follow-up assessment;Early term 45-38.6wks  P3, Mother has small blister on tip of her R nipple and the beginning of positional stripe. Observed latch.  Flanged bottom lip to improve latch. Heard and observed swallows. Suggest repositioning to football hold and watch for depth. If she slips down on the nipple, unlatch and relatch for depth. Noted baby is chomping at times with oral assessment.  Provided shells and coconut oil. Discussed if pain worsens, make Lactation OP appt. Reviewed engorgement care and monitoring voids/stools.   Maternal Data Has patient been taught Hand Expression?: Yes  Feeding Mother's Current Feeding Choice: Breast Milk  LATCH Score Latch: Grasps breast easily, tongue down, lips flanged, rhythmical sucking.  Audible Swallowing: Spontaneous and intermittent  Type of Nipple: Everted at rest and after stimulation  Comfort (Breast/Nipple): Filling, red/small blisters or bruises, mild/mod discomfort  Hold (Positioning): Assistance needed to correctly position infant at breast and maintain latch.  LATCH Score: 8   Lactation Tools Discussed/Used Tools: Shells;Coconut oil  Interventions Interventions: Support pillows;Coconut oil;Shells;Education  Discharge Pump: Personal;DEBP  Consult Status Consult Status: Complete    Shannon Levorn Lemme 09/05/2024, 1:19 PM

## 2024-09-15 ENCOUNTER — Other Ambulatory Visit: Payer: Self-pay | Admitting: Medical Genetics

## 2024-09-15 DIAGNOSIS — Z006 Encounter for examination for normal comparison and control in clinical research program: Secondary | ICD-10-CM

## 2024-09-17 ENCOUNTER — Telehealth (HOSPITAL_COMMUNITY): Payer: Self-pay | Admitting: *Deleted

## 2024-09-17 NOTE — Telephone Encounter (Signed)
 09/17/2024  Name: [name] MRN: [id number] DOB: May 20, 1991  Reason for Call:  Transition of Care Hospital Discharge Call  Contact Status: Patient Contact Status: Message  Language assistant needed:          Follow-Up Questions:    Van Postnatal Depression Scale:  In the Past 7 Days:    PHQ2-9 Depression Scale:     Discharge Follow-up:    Post-discharge interventions: NA  Mliss Sieve, RN 09/17/2024 13:50

## 2024-09-21 ENCOUNTER — Inpatient Hospital Stay (HOSPITAL_COMMUNITY): Admission: RE | Admit: 2024-09-21 | Source: Home / Self Care | Admitting: Obstetrics and Gynecology
